# Patient Record
Sex: Male | Born: 1990 | Race: White | Hispanic: No | Marital: Single | Smoking: Current every day smoker
Health system: Southern US, Community
[De-identification: ages and names within clinical notes are randomized; demographics above are authoritative.]

## PROBLEM LIST (undated history)

## (undated) DIAGNOSIS — A630 Anogenital (venereal) warts: Secondary | ICD-10-CM

## (undated) DIAGNOSIS — F1910 Other psychoactive substance abuse, uncomplicated: Secondary | ICD-10-CM

## (undated) DIAGNOSIS — F419 Anxiety disorder, unspecified: Secondary | ICD-10-CM

## (undated) DIAGNOSIS — N200 Calculus of kidney: Secondary | ICD-10-CM

## (undated) HISTORY — DX: Anxiety disorder, unspecified: F41.9

## (undated) HISTORY — PX: WISDOM TOOTH EXTRACTION: SHX21

## (undated) HISTORY — DX: Other psychoactive substance abuse, uncomplicated: F19.10

---

## 2013-07-13 ENCOUNTER — Encounter (HOSPITAL_COMMUNITY): Payer: Self-pay | Admitting: Emergency Medicine

## 2013-07-13 ENCOUNTER — Emergency Department (HOSPITAL_COMMUNITY)
Admission: EM | Admit: 2013-07-13 | Discharge: 2013-07-13 | Disposition: A | Discharge status: Home / Self Care | Payer: PRIVATE HEALTH INSURANCE | Attending: Emergency Medicine | Admitting: Emergency Medicine

## 2013-07-13 DIAGNOSIS — S61209A Unspecified open wound of unspecified finger without damage to nail, initial encounter: Secondary | ICD-10-CM | POA: Insufficient documentation

## 2013-07-13 DIAGNOSIS — F17200 Nicotine dependence, unspecified, uncomplicated: Secondary | ICD-10-CM | POA: Insufficient documentation

## 2013-07-13 DIAGNOSIS — W260XXA Contact with knife, initial encounter: Secondary | ICD-10-CM | POA: Insufficient documentation

## 2013-07-13 DIAGNOSIS — Y9389 Activity, other specified: Secondary | ICD-10-CM | POA: Insufficient documentation

## 2013-07-13 DIAGNOSIS — Y929 Unspecified place or not applicable: Secondary | ICD-10-CM | POA: Insufficient documentation

## 2013-07-13 DIAGNOSIS — S61219A Laceration without foreign body of unspecified finger without damage to nail, initial encounter: Secondary | ICD-10-CM

## 2013-07-13 NOTE — ED Provider Notes (Signed)
CSN: [id number]     Arrival date & time 07/13/13  0134 History   First MD Initiated Contact with Patient 07/13/13 0154     Chief Complaint  Patient presents with  . Extremity Laceration   (Consider location/radiation/quality/duration/timing/severity/associated sxs/prior Treatment) HPI Patient presents emergency department with a laceration to his left index finger.  Patient, states this occurred just prior to arrival while stripping, electrical wire.  Patient, states, that the area bled a fair amount, but did apply pressure, and bleeding has stopped.  Patient denies numbness, or weakness in the finger History reviewed. No pertinent past medical history. History reviewed. No pertinent past surgical history. History reviewed. No pertinent family history. History  Substance Use Topics  . Smoking status: Current Every Day Smoker  . Smokeless tobacco: Not on file  . Alcohol Use: Yes    Review of Systems All other systems negative except as documented in the HPI. All pertinent positives and negatives as reviewed in the HPI. Allergies  Review of patient's allergies indicates not on file.  Home Medications  No current outpatient prescriptions on file. BP 142/72  Pulse 96  Temp(Src) 98.3 F (36.8 C) (Oral)  Resp 20  SpO2 100% Physical Exam  Nursing note and vitals reviewed. Constitutional: He is oriented to person, place, and time. He appears well-developed and well-nourished. No distress.  HENT:  Head: Normocephalic and atraumatic.  Mouth/Throat: Oropharynx is clear and moist.  Eyes: Pupils are equal, round, and reactive to light.  Neck: Normal range of motion. Neck supple.  Musculoskeletal:       Left hand: He exhibits normal range of motion, no tenderness and no deformity. Normal sensation noted. Normal strength noted. He exhibits no finger abduction and no thumb/finger opposition.       Hands: Neurological: He is alert and oriented to person, place, and time.    ED Course    Procedures (including critical care time) LACERATION REPAIR Performed by: Carlyle Dolly Authorized by: Carlyle Dolly Consent: Verbal consent obtained. Risks and benefits: risks, benefits and alternatives were discussed Consent given by: patient Patient identity confirmed: provided demographic data Prepped and Draped in normal sterile fashion Wound explored  Laceration Location: Palmer aspect of the left index finger  Laceration Length: 2.1cm  No Foreign Bodies seen or palpated  Anesthesia: local infiltration  Local anesthetic: None   Anesthetic total: N/A   Irrigation method: syringe Amount of cleaning: standard  Skin closure: Dermabond   Number of sutures: N/A   Technique: Dermabond   Patient tolerance: Patient tolerated the procedure well with no immediate complications. Patient is advised that his wound is fairly superficial except for the deeper puncture.  Area.  Patient's full range of motion of his finger and good strength, and sensation.  Patient has normal movement and range of motion of the DIP MDM      Carlyle Dolly, PA-C 07/13/13 0236

## 2013-07-13 NOTE — ED Notes (Signed)
Pt arrived to Ed with a finger laceration.  Pt cut his left index finger while trying to strip electrical wire with a knife.  Laceration is approximately 2 cm long located on the distal portion of the left index finger.  Bleeding has stopped but pt states he bleed "a lot" prior to arrival.

## 2013-07-13 NOTE — ED Notes (Signed)
2 cm laceration noted to left index finger. No bleeding noted.

## 2013-07-13 NOTE — ED Provider Notes (Signed)
Medical screening examination/treatment/procedure(s) were performed by non-physician practitioner and as supervising physician I was immediately available for consultation/collaboration.  Sunnie Nielsen, MD 07/13/13 [phone]

## 2013-11-19 ENCOUNTER — Ambulatory Visit: Payer: Self-pay | Admitting: Family

## 2013-11-20 ENCOUNTER — Encounter: Payer: Self-pay | Admitting: Family

## 2013-11-20 ENCOUNTER — Ambulatory Visit (INDEPENDENT_AMBULATORY_CARE_PROVIDER_SITE_OTHER): Payer: PRIVATE HEALTH INSURANCE | Admitting: Family

## 2013-11-20 VITALS — BP 118/60 | HR 77 | Ht 73.0 in | Wt 164.0 lb

## 2013-11-20 DIAGNOSIS — A630 Anogenital (venereal) warts: Secondary | ICD-10-CM

## 2013-11-20 DIAGNOSIS — M545 Low back pain, unspecified: Secondary | ICD-10-CM

## 2013-11-20 DIAGNOSIS — G8929 Other chronic pain: Secondary | ICD-10-CM

## 2013-11-20 MED ORDER — PODOFILOX 0.5 % EX GEL: Freq: Two times a day (BID) | CUTANEOUS | Status: DC

## 2013-11-20 NOTE — Patient Instructions (Addendum)
Genital Warts Genital warts are a sexually transmitted infection. They may appear as small bumps on the tissues of the genital area. CAUSES  Genital warts are caused by a virus called human papillomavirus (HPV). HPV is the most common sexually transmitted disease (STD) and infection of the sex organs. This infection is spread by having unprotected sex with an infected person. It can be spread by vaginal, anal, and oral sex. Many people do not know they are infected. They may be infected for years without problems. However, even if they do not have problems, they can unknowingly pass the infection to their sexual partners. SYMPTOMS   Itching and irritation in the genital area.  Warts that bleed.  Painful sexual intercourse. DIAGNOSIS  Warts are usually recognized with the naked eye on the vagina, vulva, perineum, anus, and rectum. Certain tests can also diagnose genital warts, such as:  A Pap test.  A tissue sample (biopsy) exam.  Colposcopy. A magnifying tool is used to examine the vagina and cervix. The HPV cells will change color when certain solutions are used. TREATMENT  Warts can be removed by:  Applying certain chemicals, such as cantharidin or podophyllin.  Liquid nitrogen freezing (cryotherapy).  Immunotherapy with candida or trichophyton injections.  Laser treatment.  Burning with an electrified probe (electrocautery).  Interferon injections.  Surgery. PREVENTION  HPV vaccination can help prevent HPV infections that cause genital warts and that cause cancer of the cervix. It is recommended that the vaccination be given to people between the ages 53 to 50 years old. The vaccine might not work as well or might not work at all if you already have HPV. It should not be given to pregnant women. HOME CARE INSTRUCTIONS   It is important to follow your caregiver's instructions. The warts will not go away without treatment. Repeat treatments are often needed to get rid of warts.  Even after it appears that the warts are gone, the normal tissue underneath often remains infected.  Do not try to treat genital warts with medicine used to treat hand warts. This type of medicine is strong and can burn the skin in the genital area, causing more damage.  Tell your past and current sexual partner(s) that you have genital warts. They may be infected also and need treatment.  Avoid sexual contact while being treated.  Do not touch or scratch the warts. The infection may spread to other parts of your body.  Women with genital warts should have a cervical cancer check (Pap test) at least once a year. This type of cancer is slow-growing and can be cured if found early. Chances of developing cervical cancer are increased with HPV.  Inform your obstetrician about your warts in the event of pregnancy. This virus can be passed to the baby's respiratory tract. Discuss this with your caregiver.  Use a condom during sexual intercourse. Following treatment, the use of condoms will help prevent reinfection.  Ask your caregiver about using over-the-counter anti-itch creams. SEEK MEDICAL CARE IF:   Your treated skin becomes red, swollen, or painful.  You have a fever.  You feel generally ill.  You feel little lumps in and around your genital area.  You are bleeding or have painful sexual intercourse. MAKE SURE YOU:   Understand these instructions.  Will watch your condition.  Will get help right away if you are not doing well or get worse. Document Released: 09/23/2000 Document Revised: 12/19/2011 Document Reviewed: 04/04/2011 [facility] Patient Information 2014 [place], [place]. Safe  Sex Safe sex is about reducing the risk of giving or getting a sexually transmitted disease (STD). STDs are spread through sexual contact involving the genitals, mouth, or rectum. Some STDS can be cured and others cannot. Safe sex can also prevent unintended pregnancies.  SAFE SEX PRACTICES  Limit  your sexual activity to only one partner who is only having sex with you.  Talk to your partner about their past partners, past STDs, and drug use.  Use a condom every time you have sexual intercourse. This includes vaginal, oral, and anal sexual activity. Both females and males should wear condoms during oral sex. Only use latex or polyurethane condoms and water-based lubricants. Petroleum-based lubricants or oils used to lubricate a condom will weaken the condom and increase the chance that it will break. The condom should be in place from the beginning to the end of sexual activity. Wearing a condom reduces, but does not completely eliminate, your risk of getting or giving a STD. STDs can be spread by contact with skin of surrounding areas.  Get vaccinated for hepatitis B and HPV.  Avoid alcohol and recreational drugs which can affect your judgement. You may forget to use a condom or participate in high-risk sex.  For females, avoid douching after sexual intercourse. Douching can spread an infection farther into the reproductive tract.  Check your body for signs of sores, blisters, rashes, or unusual discharge. See your caregiver if you notice any of these signs.  Avoid sexual contact if you have symptoms of an infection or are being treated for an STD. If you or your partner has herpes, avoid sexual contact when blisters are present. Use condoms at all other times.  See your caregiver for regular screenings, examinations, and tests for STDs. Before having sex with a new partner, each of you should be screened for STDs and talk about the results with your partner. BENEFITS OF SAFE SEX   There is less of a chance of getting or giving an STD.  You can prevent unwanted or unintended pregnancies.  By discussing safer sex concerns with your partner, you may increase feelings of intimacy, comfort, trust, and honesty between the both of you. Document Released: 11/03/2004 Document Revised:  06/20/2012 Document Reviewed: 03/19/2012 [employer] Patient Information 2014 [place]

## 2013-11-20 NOTE — Progress Notes (Signed)
Pre visit review using our clinic review tool, if applicable. No additional management support is needed unless otherwise documented below in the visit note. 
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
## 2013-11-21 ENCOUNTER — Telehealth: Payer: Self-pay | Admitting: Family

## 2013-11-21 ENCOUNTER — Emergency Department (HOSPITAL_COMMUNITY): Payer: PRIVATE HEALTH INSURANCE

## 2013-11-21 ENCOUNTER — Emergency Department (HOSPITAL_COMMUNITY)
Admission: EM | Admit: 2013-11-21 | Discharge: 2013-11-21 | Disposition: A | Discharge status: Home / Self Care | Payer: PRIVATE HEALTH INSURANCE | Attending: Emergency Medicine | Admitting: Emergency Medicine

## 2013-11-21 ENCOUNTER — Encounter: Payer: Self-pay | Admitting: Family

## 2013-11-21 ENCOUNTER — Encounter (HOSPITAL_COMMUNITY): Payer: Self-pay | Admitting: Emergency Medicine

## 2013-11-21 DIAGNOSIS — F17200 Nicotine dependence, unspecified, uncomplicated: Secondary | ICD-10-CM | POA: Insufficient documentation

## 2013-11-21 DIAGNOSIS — Z79899 Other long term (current) drug therapy: Secondary | ICD-10-CM | POA: Insufficient documentation

## 2013-11-21 DIAGNOSIS — M549 Dorsalgia, unspecified: Secondary | ICD-10-CM

## 2013-11-21 DIAGNOSIS — F411 Generalized anxiety disorder: Secondary | ICD-10-CM | POA: Insufficient documentation

## 2013-11-21 DIAGNOSIS — Z87828 Personal history of other (healed) physical injury and trauma: Secondary | ICD-10-CM | POA: Insufficient documentation

## 2013-11-21 MED ORDER — DIAZEPAM 5 MG PO TABS
10.0000 mg | ORAL_TABLET | Freq: Once | ORAL | Status: AC
  Administered 2013-11-21: 10 mg via ORAL
  Filled 2013-11-21: qty 2

## 2013-11-21 MED ORDER — DIAZEPAM 5 MG PO TABS
5.0000 mg | ORAL_TABLET | Freq: Two times a day (BID) | ORAL | Status: DC
Start: 2013-11-21 — End: 2013-12-04
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
## 2013-11-21 NOTE — Discharge Instructions (Signed)
Back Pain, Adult Low back pain is very common. About 1 in 5 people have back pain.The cause of low back pain is rarely dangerous. The pain often gets better over time.About half of people with a sudden onset of back pain feel better in just 2 weeks. About 8 in 10 people feel better by 6 weeks.  CAUSES Some common causes of back pain include:  Strain of the muscles or ligaments supporting the spine.  Wear and tear (degeneration) of the spinal discs.  Arthritis.  Direct injury to the back. DIAGNOSIS Most of the time, the direct cause of low back pain is not known.However, back pain can be treated effectively even when the exact cause of the pain is unknown.Answering your caregiver's questions about your overall health and symptoms is one of the most accurate ways to make sure the cause of your pain is not dangerous. If your caregiver needs more information, he or she may order lab work or imaging tests (X-rays or MRIs).However, even if imaging tests show changes in your back, this usually does not require surgery. HOME CARE INSTRUCTIONS For many people, back pain returns.Since low back pain is rarely dangerous, it is often a condition that people can learn to manageon their own.   Remain active. It is stressful on the back to sit or stand in one place. Do not sit, drive, or stand in one place for more than 30 minutes at a time. Take short walks on level surfaces as soon as pain allows.Try to increase the length of time you walk each day.  Do not stay in bed.Resting more than 1 or 2 days can delay your recovery.  Do not avoid exercise or work.Your body is made to move.It is not dangerous to be active, even though your back may hurt.Your back will likely heal faster if you return to being active before your pain is gone.  Pay attention to your body when you bend and lift. Many people have less discomfortwhen lifting if they bend their knees, keep the load close to their bodies,and  avoid twisting. Often, the most comfortable positions are those that put less stress on your recovering back.  Find a comfortable position to sleep. Use a firm mattress and lie on your side with your knees slightly bent. If you lie on your back, put a pillow under your knees.  Only take over-the-counter or prescription medicines as directed by your caregiver. Over-the-counter medicines to reduce pain and inflammation are often the most helpful.Your caregiver may prescribe muscle relaxant drugs.These medicines help dull your pain so you can more quickly return to your normal activities and healthy exercise.  Put ice on the injured area.  Put ice in a plastic bag.  Place a towel between your skin and the bag.  Leave the ice on for 15-20 minutes, 03-04 times a day for the first 2 to 3 days. After that, ice and heat may be alternated to reduce pain and spasms.  Ask your caregiver about trying back exercises and gentle massage. This may be of some benefit.  Avoid feeling anxious or stressed.Stress increases muscle tension and can worsen back pain.It is important to recognize when you are anxious or stressed and learn ways to manage it.Exercise is a great option. SEEK MEDICAL CARE IF:  You have pain that is not relieved with rest or medicine.  You have pain that does not improve in 1 week.  You have new symptoms.  You are generally not feeling well. SEEK   IMMEDIATE MEDICAL CARE IF:   You have pain that radiates from your back into your legs.  You develop new bowel or bladder control problems.  You have unusual weakness or numbness in your arms or legs.  You develop nausea or vomiting.  You develop abdominal pain.  You feel faint. Document Released: 09/26/2005 Document Revised: 03/27/2012 Document Reviewed: 02/14/2011 [employer] Patient Information 2014 [employer].  
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
## 2013-11-21 NOTE — ED Provider Notes (Signed)
Medical screening examination/treatment/procedure(s) were performed by non-physician practitioner and as supervising physician I was immediately available for consultation/collaboration.  EKG Interpretation   None         Audree CamelScott T Albaro Deviney, MD 11/21/13 2338

## 2013-11-21 NOTE — Telephone Encounter (Signed)
Relevant patient education mailed to patient.  

## 2013-11-23 ENCOUNTER — Emergency Department (HOSPITAL_COMMUNITY): Admit: 2013-11-23 | Discharge: 2013-11-23 | Discharge status: Against Medical Advice | Payer: PRIVATE HEALTH INSURANCE

## 2013-11-25 ENCOUNTER — Other Ambulatory Visit: Payer: PRIVATE HEALTH INSURANCE

## 2013-11-27 ENCOUNTER — Other Ambulatory Visit: Payer: PRIVATE HEALTH INSURANCE

## 2013-12-02 ENCOUNTER — Other Ambulatory Visit: Payer: PRIVATE HEALTH INSURANCE

## 2013-12-04 ENCOUNTER — Ambulatory Visit (INDEPENDENT_AMBULATORY_CARE_PROVIDER_SITE_OTHER): Payer: PRIVATE HEALTH INSURANCE | Admitting: Family

## 2013-12-04 ENCOUNTER — Encounter: Payer: PRIVATE HEALTH INSURANCE | Admitting: Family

## 2013-12-04 ENCOUNTER — Encounter: Payer: Self-pay | Admitting: Family

## 2013-12-04 VITALS — BP 120/80 | HR 110 | Ht 73.0 in | Wt 172.0 lb

## 2013-12-04 DIAGNOSIS — Z0000 Encounter for general adult medical examination without abnormal findings: Secondary | ICD-10-CM

## 2013-12-04 DIAGNOSIS — M545 Low back pain, unspecified: Secondary | ICD-10-CM | POA: Insufficient documentation

## 2013-12-04 DIAGNOSIS — A630 Anogenital (venereal) warts: Secondary | ICD-10-CM | POA: Insufficient documentation

## 2013-12-04 DIAGNOSIS — G8929 Other chronic pain: Secondary | ICD-10-CM | POA: Insufficient documentation

## 2013-12-04 NOTE — Progress Notes (Signed)
Subjective:    Patient ID: William Mejia, male    DOB: 1990-10-11, 23 y.o.   MRN: [id number]  HPI 23 year old white male, smoker, is in today for complete physical exam. Has a history of chronic low back pain and is requesting to be referred to orthopedics. He's been seen in the emergency department for low back pain and was given Valium.  Report Xanax helps his pain better.    Review of Systems  Constitutional: Negative.   HENT: Negative.   Eyes: Negative.   Respiratory: Negative.   Cardiovascular: Negative.   Gastrointestinal: Negative.   Endocrine: Negative.   Genitourinary: Negative.   Musculoskeletal: Positive for back pain. Negative for gait problem, joint swelling and myalgias.  Skin: Negative.   Allergic/Immunologic: Negative.   Neurological: Negative.   Hematological: Negative.   Psychiatric/Behavioral: Negative.    Past Medical History  Diagnosis Date  . Substance abuse     meth  . Anxiety     History   Social History  . Marital Status: Single    Spouse Name: N/A    Number of Children: N/A  . Years of Education: N/A   Occupational History  . Not on file.   Social History Main Topics  . Smoking status: Current Every Day Smoker  . Smokeless tobacco: Not on file  . Alcohol Use: No  . Drug Use: Yes    Special: Marijuana     Comment: hx of methamphetamine abuse  . Sexual Activity: Yes   Other Topics Concern  . Not on file   Social History Narrative  . No narrative on file    Past Surgical History  Procedure Laterality Date  . Wisdom tooth extraction      Family History  Problem Relation Age of Onset  . Diabetes Mother     No Known Allergies  Current Outpatient Prescriptions on File Prior to Visit  Medication Sig Dispense Refill  . podofilox (CONDYLOX) 0.5 % gel Apply topically 2 (two) times daily. Twice a week only.  3.5 g  0   No current facility-administered medications on file prior to visit.    BP 120/80  Pulse 110  Ht 6'  1" (1.854 m)  Wt 172 lb (78.019 kg)  BMI 22.70 kg/m2chart    Objective:   Physical Exam  Constitutional: He is oriented to person, place, and time. He appears well-developed and well-nourished.  HENT:  Head: Normocephalic.  Right Ear: External ear normal.  Left Ear: External ear normal.  Nose: Nose normal.  Mouth/Throat: Oropharynx is clear and moist.  Eyes: Conjunctivae are normal. Pupils are equal, round, and reactive to light.  Neck: Normal range of motion. Neck supple.  Cardiovascular: Normal rate, regular rhythm and normal heart sounds.   Pulmonary/Chest: Effort normal and breath sounds normal.  Abdominal: Soft. Bowel sounds are normal.  Genitourinary: Penis normal.  Musculoskeletal: Normal range of motion.  Neurological: He is alert and oriented to person, place, and time. He has normal reflexes. No cranial nerve deficit.  Skin: Skin is warm and dry.  Psychiatric: He has a normal mood and affect.          Assessment & Plan:  William Mejia was seen today for annual exam.  Diagnoses and associated orders for this visit:  Preventative health care - CBC with Differential - TSH - POC Urinalysis Dipstick - Lipid Panel - CMP  Chronic low back pain    I have advised patient that I do not prescribe Xanax. And Xanax is  not indicated for chronic low back pain. Advised anti-inflammatory medications and followup with orthopedics as scheduled. Encouraged safe sex practices.

## 2013-12-04 NOTE — Progress Notes (Signed)
Pre visit review using our clinic review tool, if applicable. No additional management support is needed unless otherwise documented below in the visit note. 

## 2013-12-04 NOTE — Patient Instructions (Signed)
Chronic Back Pain   When back pain lasts longer than 3 months, it is called chronic back pain.People with chronic back pain often go through certain periods that are more intense (flare-ups).   CAUSES  Chronic back pain can be caused by wear and tear (degeneration) on different structures in your back. These structures include:   The bones of your spine (vertebrae) and the joints surrounding your spinal cord and nerve roots (facets).   The strong, fibrous tissues that connect your vertebrae (ligaments).  Degeneration of these structures may result in pressure on your nerves. This can lead to constant pain.  HOME CARE INSTRUCTIONS   Avoid bending, heavy lifting, prolonged sitting, and activities which make the problem worse.   Take brief periods of rest throughout the day to reduce your pain. Lying down or standing usually is better than sitting while you are resting.   Take over-the-counter or prescription medicines only as directed by your caregiver.  SEEK IMMEDIATE MEDICAL CARE IF:    You have weakness or numbness in one of your legs or feet.   You have trouble controlling your bladder or bowels.   You have nausea, vomiting, abdominal pain, shortness of breath, or fainting.  Document Released: 11/03/2004 Document Revised: 12/19/2011 Document Reviewed: 09/10/2011  Exit[employer] Patient Information 2014 [employer].

## 2013-12-06 ENCOUNTER — Telehealth: Payer: Self-pay | Admitting: Family

## 2013-12-06 NOTE — Telephone Encounter (Signed)
Relevant patient education assigned to patient using Emmi. ° °

## 2014-01-16 ENCOUNTER — Telehealth: Payer: Self-pay | Admitting: Family

## 2014-01-16 MED ORDER — EPINEPHRINE 0.3 MG/0.3ML IJ SOAJ: 0.3000 mg | Freq: Once | INTRAMUSCULAR | Status: DC

## 2014-01-16 NOTE — Telephone Encounter (Signed)
Pt would like a rx for epi-pen. Pt works outside, is allergic to bees and pine needles, poison ivy. PharmDorothy Puffer: [facility]/ [address]/  [phone]  ([facility])

## 2014-01-16 NOTE — Telephone Encounter (Signed)
Rx sent 

## 2016-01-21 ENCOUNTER — Emergency Department (HOSPITAL_COMMUNITY)
Admission: EM | Admit: 2016-01-21 | Discharge: 2016-01-21 | Disposition: A | Discharge status: Home / Self Care | Payer: PRIVATE HEALTH INSURANCE | Attending: Emergency Medicine | Admitting: Emergency Medicine

## 2016-01-21 ENCOUNTER — Encounter (HOSPITAL_COMMUNITY): Payer: Self-pay | Admitting: *Deleted

## 2016-01-21 DIAGNOSIS — S29002A Unspecified injury of muscle and tendon of back wall of thorax, initial encounter: Secondary | ICD-10-CM | POA: Diagnosis present

## 2016-01-21 DIAGNOSIS — Y998 Other external cause status: Secondary | ICD-10-CM | POA: Diagnosis not present

## 2016-01-21 DIAGNOSIS — M549 Dorsalgia, unspecified: Secondary | ICD-10-CM

## 2016-01-21 DIAGNOSIS — S199XXA Unspecified injury of neck, initial encounter: Secondary | ICD-10-CM | POA: Insufficient documentation

## 2016-01-21 DIAGNOSIS — Z79899 Other long term (current) drug therapy: Secondary | ICD-10-CM | POA: Diagnosis not present

## 2016-01-21 DIAGNOSIS — Y92410 Unspecified street and highway as the place of occurrence of the external cause: Secondary | ICD-10-CM | POA: Insufficient documentation

## 2016-01-21 DIAGNOSIS — S0990XA Unspecified injury of head, initial encounter: Secondary | ICD-10-CM | POA: Diagnosis not present

## 2016-01-21 DIAGNOSIS — F17200 Nicotine dependence, unspecified, uncomplicated: Secondary | ICD-10-CM | POA: Insufficient documentation

## 2016-01-21 DIAGNOSIS — Y9389 Activity, other specified: Secondary | ICD-10-CM | POA: Diagnosis not present

## 2016-01-21 DIAGNOSIS — Z8659 Personal history of other mental and behavioral disorders: Secondary | ICD-10-CM | POA: Diagnosis not present

## 2016-01-21 MED ORDER — IBUPROFEN 800 MG PO TABS: 800.0000 mg | ORAL_TABLET | Freq: Three times a day (TID) | ORAL | Status: DC

## 2016-01-21 MED ORDER — METHOCARBAMOL 500 MG PO TABS: 500.0000 mg | ORAL_TABLET | Freq: Two times a day (BID) | ORAL | Status: DC
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
## 2016-05-16 ENCOUNTER — Encounter (HOSPITAL_COMMUNITY): Payer: Self-pay

## 2016-05-16 ENCOUNTER — Emergency Department (HOSPITAL_COMMUNITY)
Admission: EM | Admit: 2016-05-16 | Discharge: 2016-05-16 | Disposition: A | Discharge status: Home / Self Care | Payer: PRIVATE HEALTH INSURANCE | Attending: Emergency Medicine | Admitting: Emergency Medicine

## 2016-05-16 DIAGNOSIS — F17200 Nicotine dependence, unspecified, uncomplicated: Secondary | ICD-10-CM | POA: Insufficient documentation

## 2016-05-16 DIAGNOSIS — B9789 Other viral agents as the cause of diseases classified elsewhere: Secondary | ICD-10-CM

## 2016-05-16 DIAGNOSIS — J069 Acute upper respiratory infection, unspecified: Secondary | ICD-10-CM | POA: Insufficient documentation

## 2016-05-16 DIAGNOSIS — K0889 Other specified disorders of teeth and supporting structures: Secondary | ICD-10-CM | POA: Insufficient documentation

## 2016-05-16 LAB — RAPID STREP SCREEN (MED CTR MEBANE ONLY): Streptococcus, Group A Screen (Direct): NEGATIVE

## 2016-05-16 MED ORDER — KETOROLAC TROMETHAMINE 60 MG/2ML IM SOLN
30.0000 mg | Freq: Once | INTRAMUSCULAR | Status: AC
  Administered 2016-05-16: 30 mg via INTRAMUSCULAR
  Filled 2016-05-16: qty 2

## 2016-05-16 MED ORDER — PENICILLIN V POTASSIUM 500 MG PO TABS: 500.0000 mg | ORAL_TABLET | Freq: Three times a day (TID) | ORAL | 0 refills | Status: DC

## 2016-05-16 MED ORDER — NAPROXEN 500 MG PO TABS: 500.0000 mg | ORAL_TABLET | Freq: Two times a day (BID) | ORAL | 0 refills | Status: DC

## 2016-05-16 MED ORDER — PROMETHAZINE-PHENYLEPH-CODEINE 6.25-5-10 MG/5ML PO SYRP: 5.0000 mL | ORAL_SOLUTION | Freq: Four times a day (QID) | ORAL | 0 refills | Status: DC | PRN

## 2016-05-16 NOTE — ED Notes (Signed)
Patient states his tooth broke off during eating. He states he feels like that is what is wrong with him.

## 2016-05-16 NOTE — ED Triage Notes (Signed)
Pt states recently had URI. Pt complaining of sinus congestion and pain. Pt states some productive cough.

## 2016-05-16 NOTE — ED Provider Notes (Signed)
[facility]-EMERGENCY DEPT Provider Note   CSN: [id number] Arrival date & time: 05/16/16  2043  First Provider Contact:  None       History   Chief Complaint Chief Complaint  Patient presents with  . Cough  . Nasal Congestion    HPI William Mejia is a 25 y.o. male.  Patient presents with several day history of nasal congestion, rhinorrhea, cough, sore throat.  Broken lower molar on right yesterday with onset of facial swelling today.  No shortness of breath, difficulty swallowing.      Past Medical History:  Diagnosis Date  . Anxiety   . Substance abuse    meth    Patient Active Problem List   Diagnosis Date Noted  . Chronic low back pain 12/04/2013  . 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 WISDOM TOOTH EXTRACTION         Home Medications    Prior to Admission medications   Medication Sig Start Date End Date Taking? Authorizing Provider  EPINEPHrine (EPI-PEN) 0.3 mg/0.3 mL SOAJ injection Inject 0.3 mLs (0.3 mg total) into the muscle once. 01/16/14   Eulis FosterPadonda B Webb, FNP  ibuprofen (ADVIL,MOTRIN) 800 MG tablet Take 1 tablet (800 mg total) by mouth 3 (three) times daily. 01/21/16   Mady GemmaElizabeth C Westfall, PA-C  methocarbamol (ROBAXIN) 500 MG tablet Take 1 tablet (500 mg total) by mouth 2 (two) times daily. 01/21/16   Mady GemmaElizabeth C Westfall, PA-C  podofilox (CONDYLOX) 0.5 % gel Apply topically 2 (two) times daily. Twice a week only. 11/20/13   Eulis FosterPadonda B Webb, FNP    Family History Family History  Problem Relation Age of Onset  . Diabetes Mother     Social History Social History  Substance Use Topics  . Smoking status: Current Every Day Smoker  . Smokeless tobacco: Never Used  . Alcohol use No     Allergies   Review of patient's allergies indicates no known allergies.   Review of Systems Review of Systems  HENT: Positive for congestion, dental problem and sore throat.   Respiratory: Positive for cough.     All other systems reviewed and are negative.    Physical Exam Updated Vital Signs BP 142/68   Pulse 100   Temp 98.3 F (36.8 C) (Oral)   Resp 16   Ht 6\' 1"  (1.854 m)   Wt 84.8 kg   SpO2 99%   BMI 24.67 kg/m   Physical Exam  Constitutional: He is oriented to person, place, and time. He appears well-developed and well-nourished.  HENT:  Head: Normocephalic and atraumatic.  Mouth/Throat: Abnormal dentition. Posterior oropharyngeal erythema present. No oropharyngeal exudate.    Eyes: Conjunctivae are normal.  Neck: Neck supple.  Cardiovascular: Normal rate and regular rhythm.   Pulmonary/Chest: Effort normal and breath sounds normal.  Abdominal: Soft. Bowel sounds are normal.  Musculoskeletal: Normal range of motion.  Lymphadenopathy:    He has no cervical adenopathy.  Neurological: He is alert and oriented to person, place, and time.  Skin: Skin is warm and dry. No rash noted.  Nursing note and vitals reviewed.    ED Treatments / Results  Labs (all labs ordered are listed, but only abnormal results are displayed) Labs Reviewed  RAPID STREP SCREEN (NOT [facility])  CULTURE, GROUP A STREP [facility])    EKG  EKG Interpretation None       Radiology No results found.  Procedures Procedures (including critical care time)  Medications Ordered in ED Medications - No data to display   Initial Impression / Assessment and Plan / ED Course  I have reviewed the triage vital signs and the nursing notes.  Pertinent labs & imaging results that were available during my care of the patient were reviewed by me and considered in my medical decision making (see chart for details).  Clinical Course  Patient with dentalgia.  No abscess requiring immediate incision and drainage.  Exam not concerning for Ludwig's angina or pharyngeal abscess.  Will treat with penicillin and naprosyn. Pt instructed to follow-up with dentist.  Discussed return precautions. Pt safe for discharge. Pt  symptoms consistent with URI. Pt will be discharged with symptomatic treatment.  Discussed return precautions.  Pt is hemodynamically stable & in NAD prior to discharge.   Final Clinical Impressions(s) / ED Diagnoses   Final diagnoses:  None  Viral URI with cough. Dentalgia.  New Prescriptions New Prescriptions   No medications on file     Felicie Morn, NP 05/17/16 0865    Pricilla Loveless, MD 05/18/16 [phone]

## 2016-05-19 LAB — CULTURE, GROUP A STREP (THRC)

## 2016-11-23 ENCOUNTER — Emergency Department (HOSPITAL_COMMUNITY)
Admission: EM | Admit: 2016-11-23 | Discharge: 2016-11-24 | Disposition: A | Discharge status: Home / Self Care | Payer: PRIVATE HEALTH INSURANCE | Attending: Emergency Medicine | Admitting: Emergency Medicine

## 2016-11-23 ENCOUNTER — Encounter (HOSPITAL_COMMUNITY): Payer: Self-pay | Admitting: Family Medicine

## 2016-11-23 DIAGNOSIS — F17200 Nicotine dependence, unspecified, uncomplicated: Secondary | ICD-10-CM | POA: Insufficient documentation

## 2016-11-23 DIAGNOSIS — K0889 Other specified disorders of teeth and supporting structures: Secondary | ICD-10-CM | POA: Diagnosis not present

## 2016-11-23 MED ORDER — ACETAMINOPHEN 325 MG PO TABS
650.0000 mg | ORAL_TABLET | Freq: Once | ORAL | Status: AC
  Administered 2016-11-24: 650 mg via ORAL
  Filled 2016-11-23: qty 2

## 2016-11-23 MED ORDER — IBUPROFEN 800 MG PO TABS
800.0000 mg | ORAL_TABLET | Freq: Once | ORAL | Status: AC
  Administered 2016-11-24: 800 mg via ORAL
  Filled 2016-11-23: qty 1

## 2016-11-23 MED ORDER — PENICILLIN V POTASSIUM 500 MG PO TABS: 500.0000 mg | ORAL_TABLET | Freq: Two times a day (BID) | ORAL | 0 refills | Status: AC

## 2016-11-23 NOTE — ED Triage Notes (Signed)
Patient is complaining of right lower toothache that started a week ago. Patient reports part of his molar has broke away and had complications before with this tooth.
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
## 2016-11-23 NOTE — ED Provider Notes (Signed)
[facility]-EMERGENCY DEPT Provider Note   CSN: [id number] Arrival date & time: 11/23/16  2232     History   Chief Complaint Chief Complaint  Patient presents with  . Dental Pain    HPI William Mejia is a 26 y.o. male.  HPI  26 y.o. male with a hx of Substance Abuse, presents to the Emergency Department today complaining of right lower tooth pain x 1 week. States molar broke away. Notes pain constantly with pain 8/10. No swelling. Able to tolerate PO. No N/V. No fevers. Attempted Motrin with moderate relief. PT has dentist, but has not followed up due to work. No other symptoms noted.    Past Medical History:  Diagnosis Date  . Anxiety   . Substance abuse    meth    Patient Active Problem List   Diagnosis Date Noted  . Chronic low back pain 12/04/2013  . 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 WISDOM TOOTH EXTRACTION         Home Medications    Prior to Admission medications   Medication Sig Start Date End Date Taking? Authorizing Provider  EPINEPHrine (EPI-PEN) 0.3 mg/0.3 mL SOAJ injection Inject 0.3 mLs (0.3 mg total) into the muscle once. 01/16/14   Eulis FosterPadonda B Webb, FNP  ibuprofen (ADVIL,MOTRIN) 800 MG tablet Take 1 tablet (800 mg total) by mouth 3 (three) times daily. 01/21/16   Mady GemmaElizabeth C Westfall, PA-C  methocarbamol (ROBAXIN) 500 MG tablet Take 1 tablet (500 mg total) by mouth 2 (two) times daily. 01/21/16   Mady GemmaElizabeth C Westfall, PA-C  naproxen (NAPROSYN) 500 MG tablet Take 1 tablet (500 mg total) by mouth 2 (two) times daily. 05/16/16   Felicie Mornavid Smith, NP  penicillin v potassium (VEETID) 500 MG tablet Take 1 tablet (500 mg total) by mouth 3 (three) times daily. 05/16/16   Felicie Mornavid Smith, NP  podofilox (CONDYLOX) 0.5 % gel Apply topically 2 (two) times daily. Twice a week only. 11/20/13   Eulis FosterPadonda B Webb, FNP  Promethazine-Phenyleph-Codeine 6.25-5-10 MG/5ML SYRP Take 5 mLs by mouth every 6 (six) hours as needed. 05/16/16    Felicie Mornavid Smith, NP    Family History Family History  Problem Relation Age of Onset  . Diabetes Mother     Social History Social History  Substance Use Topics  . Smoking status: Current Every Day Smoker  . Smokeless tobacco: Never Used  . Alcohol use No     Allergies   Patient has no known allergies.   Review of Systems Review of Systems  Constitutional: Negative for fever.  HENT: Positive for dental problem. Negative for trouble swallowing.      Physical Exam Updated Vital Signs BP 135/70 (BP Location: Right Arm)   Pulse 71   Temp 98.3 F (36.8 C) (Oral)   Resp 18   Ht 6\' 1"  (1.854 m)   Wt 83.9 kg   SpO2 100%   BMI 24.41 kg/m   Physical Exam  Constitutional: He is oriented to person, place, and time. Vital signs are normal. He appears well-developed and well-nourished.  HENT:  Head: Normocephalic.  Right Ear: Hearing normal.  Left Ear: Hearing normal.  Left molar with poor dentition. No abscess. No trismus. No uvula deviation. NO signs of infection   Eyes: Conjunctivae and EOM are normal. Pupils are equal, round, and reactive to light.  Cardiovascular: Normal rate and regular rhythm.   Pulmonary/Chest: Effort normal.  Neurological: He is alert and oriented to person,  place, and time.  Skin: Skin is warm and dry.  Psychiatric: He has a normal mood and affect. His speech is normal and behavior is normal. Thought content normal.   ED Treatments / Results  Labs (all labs ordered are listed, but only abnormal results are displayed) Labs Reviewed - No data to display  EKG  EKG Interpretation None       Radiology No results found.  Procedures Procedures (including critical care time)  Medications Ordered in ED Medications - No data to display   Initial Impression / Assessment and Plan / ED Course  I have reviewed the triage vital signs and the nursing notes.  Pertinent labs & imaging results that were available during my care of the patient were  reviewed by me and considered in my medical decision making (see chart for details).  Final Clinical Impressions(s) / ED Diagnoses     {I have reviewed the relevant previous healthcare records.  {I obtained HPI from historian.   ED Course:  Assessment: Dental pain associated with dental cary but no signs or symptoms of dental abscess with patient afebrile, non toxic appearing and swallowing secretions well. Exam unconcerning for Ludwig's angina or other deep tissue infection in neck. As there is no facial swelling or gum findings. Will treat with pain medication.  I gave patient referral to dentist and stressed the importance of dental follow up for ultimate management of dental pain. Patient voices understanding and is agreeable to plan.  Disposition/Plan:  DC Home Additional Verbal discharge instructions given and discussed with patient.  Pt Instructed to f/u with Dentist in the next week for evaluation and treatment of symptoms. Return precautions given Pt acknowledges and agrees with plan  Supervising Physician Canary Brim Tegeler, MD  Final diagnoses:  Pain, dental    New Prescriptions New Prescriptions   No medications on file     Audry Pili, PA-C 11/24/16 0000    Canary Brim Tegeler, MD 11/24/16 [phone]

## 2016-11-24 MED ORDER — IBUPROFEN 800 MG PO TABS: 800.0000 mg | ORAL_TABLET | Freq: Four times a day (QID) | ORAL | 0 refills | Status: DC | PRN

## 2017-03-27 ENCOUNTER — Encounter (HOSPITAL_BASED_OUTPATIENT_CLINIC_OR_DEPARTMENT_OTHER): Payer: Self-pay | Admitting: *Deleted

## 2017-03-27 ENCOUNTER — Emergency Department (HOSPITAL_BASED_OUTPATIENT_CLINIC_OR_DEPARTMENT_OTHER)
Admission: EM | Admit: 2017-03-27 | Discharge: 2017-03-28 | Disposition: A | Discharge status: Home / Self Care | Payer: BLUE CROSS/BLUE SHIELD | Attending: Emergency Medicine | Admitting: Emergency Medicine

## 2017-03-27 ENCOUNTER — Emergency Department (HOSPITAL_BASED_OUTPATIENT_CLINIC_OR_DEPARTMENT_OTHER): Payer: BLUE CROSS/BLUE SHIELD

## 2017-03-27 DIAGNOSIS — R1032 Left lower quadrant pain: Secondary | ICD-10-CM | POA: Diagnosis present

## 2017-03-27 DIAGNOSIS — N50812 Left testicular pain: Secondary | ICD-10-CM | POA: Diagnosis not present

## 2017-03-27 DIAGNOSIS — N23 Unspecified renal colic: Secondary | ICD-10-CM

## 2017-03-27 DIAGNOSIS — F17200 Nicotine dependence, unspecified, uncomplicated: Secondary | ICD-10-CM | POA: Insufficient documentation

## 2017-03-27 DIAGNOSIS — N132 Hydronephrosis with renal and ureteral calculous obstruction: Secondary | ICD-10-CM | POA: Diagnosis not present

## 2017-03-27 DIAGNOSIS — N451 Epididymitis: Secondary | ICD-10-CM | POA: Diagnosis not present

## 2017-03-27 LAB — URINALYSIS, ROUTINE W REFLEX MICROSCOPIC
BILIRUBIN URINE: NEGATIVE
Glucose, UA: NEGATIVE mg/dL
Ketones, ur: NEGATIVE mg/dL
Nitrite: NEGATIVE
PH: 6.5 (ref 5.0–8.0)
Protein, ur: NEGATIVE mg/dL
SPECIFIC GRAVITY, URINE: 1.02 (ref 1.005–1.030)

## 2017-03-27 LAB — URINALYSIS, MICROSCOPIC (REFLEX)

## 2017-03-27 MED ORDER — HYDROMORPHONE HCL 1 MG/ML IJ SOLN
2.0000 mg | Freq: Once | INTRAMUSCULAR | Status: AC
Start: 2017-03-27 — End: 2017-03-27
  Administered 2017-03-27: 2 mg via INTRAMUSCULAR
  Filled 2017-03-27: qty 2

## 2017-03-27 MED ORDER — ONDANSETRON 8 MG PO TBDP
8.0000 mg | ORAL_TABLET | Freq: Once | ORAL | Status: AC
  Administered 2017-03-27: 8 mg via ORAL
  Filled 2017-03-27: qty 1

## 2017-03-27 NOTE — ED Triage Notes (Signed)
[facility] was seen at [facility] 2 hours ago for pain in his left testicle. 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

## 2017-03-27 NOTE — ED Provider Notes (Signed)
[facility] Provider Note: William DellJ. Lane [name], MD, FACEP  CSN: [id number] MRN: [id number] ARRIVAL: 03/27/17 at 2137 ROOM: MH11/[facility]11   CHIEF COMPLAINT  Testicle Pain   HISTORY OF PRESENT ILLNESS  William Mejia is a 26 y.o. male who developed the fairly sudden onset of left groin and testicular pain about 4 PM at work today. He changes tires for living but there was not a specific injury that he is aware of. He rates his pain as an 8 out of 10 and the pain is worse with palpation of the testicle or left lower quadrant. There was associated nausea and vomiting. He was seen in an [facility] and was told he had microscopic hematuria but no evidence of a urinary tract infection. He was sent here for further evaluation. He is able to urinate without difficulty or discomfort.   [place] with [place] [facility] [place] substances [place] reveals the patient has received one prescription for 10 hydrocodone tablets and one prescription for Phenergan with codeine syrup in the past year..   Past Medical History:  Diagnosis Date  . Anxiety   . Substance abuse    meth    Past Surgical History:  Procedure Laterality Date  . WISDOM TOOTH EXTRACTION      Family History  Problem Relation Age of Onset  . Diabetes Mother     Social History  Substance Use Topics  . Smoking status: Current Every Day Smoker  . Smokeless tobacco: Never Used  . Alcohol use No    Prior to Admission medications   Not on File    Allergies Patient has no known allergies.   REVIEW OF SYSTEMS  Negative except as noted here or in the History of Present Illness.   PHYSICAL EXAMINATION  Initial Vital Signs Blood pressure 138/85, pulse 90, temperature 98.3 F (36.8 C), temperature source Oral, resp. rate 18, height 6\' 1"  (1.854 m), weight 77.6 kg (171 lb), SpO2 100 %.  Examination General: Well-developed, well-nourished male in no acute distress; appearance consistent with  age of record HENT: normocephalic; atraumatic Eyes: pupils equal, round and reactive to light; extraocular muscles intact Neck: supple Heart: regular rate and rhythm Lungs: clear to auscultation bilaterally Abdomen: soft; nondistended; left lower quadrant tenderness; no masses or hepatosplenomegaly; bowel sounds present GU: Tanner 5 male, circumcised; testicles descended bilaterally with normal alignment; left testicular and inguinal tenderness without hernia or mass palpated Extremities: No deformity; full range of motion; pulses normal Neurologic: Awake, alert and oriented; motor function intact in all extremities and symmetric; no facial droop Skin: Warm and dry Psychiatric: Normal mood and affect   RESULTS  Summary of this visit's results, reviewed by myself:   EKG Interpretation  Date/Time:    Ventricular Rate:    PR Interval:    QRS Duration:   QT Interval:    QTC Calculation:   R Axis:     Text Interpretation:        Laboratory Studies: Results for orders placed or performed during the hospital encounter of 03/27/17 (from the past 24 hour(s))  Urinalysis, Routine w reflex microscopic     Status: Abnormal   Collection Time: 03/27/17 10:36 PM  Result Value Ref Range   Color, Urine YELLOW YELLOW   APPearance CLOUDY (A) CLEAR   Specific Gravity, Urine 1.020 1.005 - 1.030   pH 6.5 5.0 - 8.0   Glucose, UA NEGATIVE NEGATIVE mg/dL   Hgb urine dipstick LARGE (A) NEGATIVE   Bilirubin Urine NEGATIVE  NEGATIVE   Ketones, ur NEGATIVE NEGATIVE mg/dL   Protein, ur NEGATIVE NEGATIVE mg/dL   Nitrite NEGATIVE NEGATIVE   Leukocytes, UA TRACE (A) NEGATIVE  Urinalysis, Microscopic (reflex)     Status: Abnormal   Collection Time: 03/27/17 10:36 PM  Result Value Ref Range   RBC / HPF TOO NUMEROUS TO COUNT 0 - 5 RBC/hpf   WBC, UA 0-5 0 - 5 WBC/hpf   Bacteria, UA MANY (A) NONE SEEN   Squamous Epithelial / LPF 0-5 (A) NONE SEEN   Ca Oxalate Crys, UA PRESENT    Imaging Studies: Ct  Renal Stone Study  Result Date: 03/27/2017 CLINICAL DATA:  Left groin pain, onset today. EXAM: CT ABDOMEN AND PELVIS WITHOUT CONTRAST TECHNIQUE: Multidetector CT imaging of the abdomen and pelvis was performed following the standard protocol without IV contrast. COMPARISON:  None. FINDINGS: Lower chest: The lung bases are clear. No consolidation or pleural fluid. Hepatobiliary: The unenhanced liver is unremarkable. No evidence of focal lesion allowing for lack contrast. Gallbladder physiologically distended, no calcified stone. No biliary dilatation. Pancreas: No ductal dilatation or inflammation. Spleen: Normal in size without focal abnormality. Adrenals/Urinary Tract: Minimally obstructing 4 x 5 mm stone in the distal left ureter with minimal proximal left hydroureteronephrosis. No significant perinephric edema. No additional nonobstructing stone in either kidney. No right hydronephrosis. Right ureter is decompressed. Urinary bladder is minimally distended without bladder stone. The adrenal glands are normal. Stomach/Bowel: Stomach is within normal limits. Appendix appears normal. No evidence of bowel wall thickening, distention, or inflammatory changes. Vascular/Lymphatic: Abdominal aorta is normal in caliber. No abdominopelvic adenopathy. Reproductive: Prostate is unremarkable. Other: Bilateral pelvic phleboliths. No free air, free fluid, or intra-abdominal fluid collection. No abdominal wall hernia. Musculoskeletal: There are no acute or suspicious osseous abnormalities. Scattered Schmorl's nodes in the lower thoracic and upper lumbar spine. IMPRESSION: Obstructing 5 x 4 mm stone in the distal left ureter with mild left hydroureteronephrosis. Electronically Signed   By: Rubye Oaks M.D.   On: 03/27/2017 23:42    ED COURSE  Nursing notes and initial vitals signs, including pulse oximetry, reviewed.  Vitals:   03/27/17 2157 03/27/17 2159  BP:  138/85  Pulse:  90  Resp:  18  Temp:  98.3 F (36.8  C)  TempSrc:  Oral  SpO2:  100%  Weight: 77.6 kg (171 lb)   Height: 6\' 1"  (1.854 m)    12:03 AM Pain significantly improved after IM Dilaudid.  PROCEDURES    ED DIAGNOSES     ICD-10-CM   1. Ureteral colic N23        Alisah Grandberry, MD 03/28/17 0006

## 2017-03-27 NOTE — ED Notes (Signed)
Returned from xray

## 2017-03-28 MED ORDER — HYDROMORPHONE HCL 4 MG PO TABS: 2.0000 mg | ORAL_TABLET | ORAL | 0 refills | Status: DC | PRN

## 2017-03-28 MED ORDER — TAMSULOSIN HCL 0.4 MG PO CAPS: ORAL_CAPSULE | ORAL | 0 refills | Status: DC

## 2017-03-28 MED ORDER — ONDANSETRON 8 MG PO TBDP: 8.0000 mg | ORAL_TABLET | Freq: Three times a day (TID) | ORAL | 0 refills | Status: DC | PRN

## 2017-03-28 MED ORDER — TAMSULOSIN HCL 0.4 MG PO CAPS
0.4000 mg | ORAL_CAPSULE | Freq: Once | ORAL | Status: AC
  Administered 2017-03-28: 0.4 mg via ORAL
  Filled 2017-03-28: qty 1

## 2017-03-29 LAB — URINE CULTURE: CULTURE: NO GROWTH

## 2017-04-04 ENCOUNTER — Encounter (HOSPITAL_BASED_OUTPATIENT_CLINIC_OR_DEPARTMENT_OTHER): Payer: Self-pay

## 2017-04-04 ENCOUNTER — Emergency Department (HOSPITAL_BASED_OUTPATIENT_CLINIC_OR_DEPARTMENT_OTHER)
Admission: EM | Admit: 2017-04-04 | Discharge: 2017-04-04 | Disposition: A | Discharge status: Home / Self Care | Payer: BLUE CROSS/BLUE SHIELD | Attending: Emergency Medicine | Admitting: Emergency Medicine

## 2017-04-04 DIAGNOSIS — F17200 Nicotine dependence, unspecified, uncomplicated: Secondary | ICD-10-CM | POA: Diagnosis not present

## 2017-04-04 DIAGNOSIS — N200 Calculus of kidney: Secondary | ICD-10-CM | POA: Diagnosis not present

## 2017-04-04 DIAGNOSIS — R109 Unspecified abdominal pain: Secondary | ICD-10-CM | POA: Diagnosis not present

## 2017-04-04 DIAGNOSIS — R1032 Left lower quadrant pain: Secondary | ICD-10-CM | POA: Diagnosis present

## 2017-04-04 HISTORY — DX: Calculus of kidney: N20.0

## 2017-04-04 LAB — URINALYSIS, ROUTINE W REFLEX MICROSCOPIC
BILIRUBIN URINE: NEGATIVE
GLUCOSE, UA: NEGATIVE mg/dL
Hgb urine dipstick: NEGATIVE
Ketones, ur: NEGATIVE mg/dL
Leukocytes, UA: NEGATIVE
NITRITE: NEGATIVE
PH: 8 (ref 5.0–8.0)
Protein, ur: NEGATIVE mg/dL
SPECIFIC GRAVITY, URINE: 1.019 (ref 1.005–1.030)

## 2017-04-04 LAB — CBC
HEMATOCRIT: 44.5 % (ref 39.0–52.0)
HEMOGLOBIN: 15.2 g/dL (ref 13.0–17.0)
MCH: 31.3 pg (ref 26.0–34.0)
MCHC: 34.2 g/dL (ref 30.0–36.0)
MCV: 91.8 fL (ref 78.0–100.0)
Platelets: 338 10*3/uL (ref 150–400)
RBC: 4.85 MIL/uL (ref 4.22–5.81)
RDW: 13.5 % (ref 11.5–15.5)
WBC: 9.2 10*3/uL (ref 4.0–10.5)

## 2017-04-04 LAB — BASIC METABOLIC PANEL
Anion gap: 7 (ref 5–15)
BUN: 13 mg/dL (ref 6–20)
CALCIUM: 9.5 mg/dL (ref 8.9–10.3)
CO2: 27 mmol/L (ref 22–32)
Chloride: 105 mmol/L (ref 101–111)
Creatinine, Ser: 1.22 mg/dL (ref 0.61–1.24)
GFR calc Af Amer: >60 mL/min (ref >60)
GLUCOSE: 94 mg/dL (ref 65–99)
POTASSIUM: 4.6 mmol/L (ref 3.5–5.1)
Sodium: 139 mmol/L (ref 135–145)

## 2017-04-04 LAB — URINALYSIS, MICROSCOPIC (REFLEX)

## 2017-04-04 MED ORDER — KETOROLAC TROMETHAMINE 10 MG PO TABS: 10.0000 mg | ORAL_TABLET | Freq: Four times a day (QID) | ORAL | 0 refills | Status: DC | PRN

## 2017-04-04 MED ORDER — SODIUM CHLORIDE 0.9 % IV BOLUS (SEPSIS)
500.0000 mL | Freq: Once | INTRAVENOUS | Status: AC
  Administered 2017-04-04: 500 mL via INTRAVENOUS

## 2017-04-04 MED ORDER — KETOROLAC TROMETHAMINE 30 MG/ML IJ SOLN
30.0000 mg | Freq: Once | INTRAMUSCULAR | Status: AC
  Administered 2017-04-04: 30 mg via INTRAVENOUS
  Filled 2017-04-04: qty 1

## 2017-04-04 MED ORDER — SODIUM CHLORIDE 0.9 % IV SOLN: 1000.0000 mL | INTRAVENOUS | Status: DC

## 2017-04-04 MED FILL — KETOROLAC 10 MG TABLET: 10 | 4 days supply | Qty: 15 | Fill #0

## 2017-04-04 NOTE — ED Notes (Signed)
Requests to be checked for STD because of a thing he had with his girl and the babysitter. Denies discharge or dysuria

## 2017-04-04 NOTE — ED Provider Notes (Signed)
[facility]   CSN: [id number] Arrival date & time: 04/04/17  1147     History   Chief Complaint Chief Complaint  Patient presents with  . Flank Pain    HPI William Mejia is a 26 y.o. male presenting with one-week history left-sided flank pain.  Patient states that he started having pain last week. He came to the ED, and was diagnosed with a kidney stone. Since his visit to the ED, he has continued to have intermittent, colicky pain on that left side. The pain is not getting worse, but has not improved. He states he is trying not to take the prescribed pain medicine, as he cannot take it while he is at work and has not been using any over-the-counter medicine such as ibuprofen or Tylenol. He reports that recently he feels like he is having increased difficulty urinating as and he feels the stone is obstructing his flow. Additionally he reports some suprapubic discomfort. He denies fevers or chills, but reports some intermittent nausea. He last threw up on Thursday. He denies dysuria. He has increased frequency of urination, but is taking Flomax. He requests that after discharge, he gets a prescription for pain management that it is not as strong as dilaudid. He did not schedule appointment with urology, as he did not think he would need to follow-up with them. Additionally, patient states he wants to be tested for STDs. He states he had unsafe practices, and wants testing. He denies penile discharge, itching, or pain. He denies swelling or pain of the penis or scrotum.  HPI  Past Medical History:  Diagnosis Date  . Anxiety   . Kidney stone   . Substance abuse    meth    Patient Active Problem List   Diagnosis Date Noted  . Chronic low back pain 12/04/2013  . 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 WISDOM TOOTH EXTRACTION         Home Medications    Prior to Admission medications     Medication Sig Start Date End Date Taking? Authorizing Provider  HYDROmorphone (DILAUDID) 4 MG tablet Take 0.5 tablets (2 mg total) by mouth every 4 (four) hours as needed for severe pain. 03/28/17   Molpus, John, MD  ketorolac (TORADOL) 10 MG tablet Take 1 tablet (10 mg total) by mouth every 6 (six) hours as needed for severe pain. 04/04/17   Zanaria Morell, PA-C  ondansetron (ZOFRAN ODT) 8 MG disintegrating tablet Take 1 tablet (8 mg total) by mouth every 8 (eight) hours as needed for nausea or vomiting. 03/28/17   Molpus, John, MD  tamsulosin (FLOMAX) 0.4 MG CAPS capsule Take one tablet daily until stone passes. 03/28/17   Molpus, John, MD    Family History Family History  Problem Relation Age of Onset  . Diabetes Mother     Social History Social History  Substance Use Topics  . Smoking status: Current Every Day Smoker  . Smokeless tobacco: Never Used  . Alcohol use No     Allergies   Patient has no known allergies.   Review of Systems Review of Systems  Constitutional: Negative for chills and fever.  HENT: Negative for sore throat.   Eyes: Negative for photophobia and visual disturbance.  Respiratory: Negative for cough and shortness of breath.   Cardiovascular: Negative for chest pain.  Gastrointestinal: Positive for abdominal pain (L flank and suprapubic). Negative for constipation, diarrhea, nausea and vomiting.  Endocrine: Negative for polydipsia and polyuria.  Genitourinary: Positive for difficulty urinating, flank pain and frequency. Negative for discharge, dysuria, hematuria, penile pain, penile swelling, scrotal swelling and testicular pain.  Musculoskeletal: Negative for back pain, neck pain and neck stiffness.  Skin: Negative for rash.  Neurological: Negative for dizziness and headaches.  Psychiatric/Behavioral: Negative for confusion.     Physical Exam Updated Vital Signs BP 110/62 (BP Location: Left Arm)   Pulse 63   Temp 98 F (36.7 C) (Oral)   Resp  18   Ht 6\' 1"  (1.854 m)   Wt 77.6 kg (171 lb)   SpO2 100%   BMI 22.56 kg/m   Physical Exam  Constitutional: He is oriented to person, place, and time. He appears well-developed and well-nourished. No distress.  HENT:  Head: Normocephalic and atraumatic.  Eyes: Conjunctivae and EOM are normal. Pupils are equal, round, and reactive to light.  Neck: Normal range of motion. Neck supple.  Cardiovascular: Normal rate, regular rhythm, normal heart sounds and intact distal pulses.   Pulmonary/Chest: Effort normal and breath sounds normal. No respiratory distress. He has no wheezes.  Abdominal: Soft. Bowel sounds are normal. He exhibits no distension. There is CVA tenderness. There is no rigidity, no rebound, no tenderness at McBurney's point and negative Murphy's sign. Hernia confirmed negative in the right inguinal area and confirmed negative in the left inguinal area.  Minimal suprapubic tenderness. Positive CVA on L side  Genitourinary: Testes normal and penis normal. Right testis shows no mass, no swelling and no tenderness. Left testis shows no mass, no swelling and no tenderness. No penile tenderness. No discharge found.  Musculoskeletal: Normal range of motion.  Lymphadenopathy: No inguinal adenopathy noted on the right or left side.  Neurological: He is alert and oriented to person, place, and time.  Skin: Skin is warm and dry.  Psychiatric: He has a normal mood and affect.  Nursing note and vitals reviewed.    ED Treatments / Results  Labs (all labs ordered are listed, but only abnormal results are displayed) Labs Reviewed  URINALYSIS, ROUTINE W REFLEX MICROSCOPIC - Abnormal; Notable for the following:       Result Value   APPearance TURBID (*)    All other components within normal limits  URINALYSIS, MICROSCOPIC (REFLEX) - Abnormal; Notable for the following:    Bacteria, UA RARE (*)    Squamous Epithelial / LPF 0-5 (*)    All other components within normal limits  BASIC  METABOLIC PANEL  CBC  RPR  HIV ANTIBODY (ROUTINE TESTING)  GC/CHLAMYDIA PROBE AMP ([place]    EKG  EKG Interpretation None       Radiology No results found.  Procedures Procedures (including critical care time)  Medications Ordered in ED Medications  sodium chloride 0.9 % bolus 500 mL (0 mLs Intravenous Stopped 04/04/17 1307)  ketorolac (TORADOL) 30 MG/ML injection 30 mg (30 mg Intravenous Given 04/04/17 1237)     Initial Impression / Assessment and Plan / ED Course  I have reviewed the triage vital signs and the nursing notes.  Pertinent labs & imaging results that were available during my care of the patient were reviewed by me and considered in my medical decision making (see chart for details).     On initial assessment, patient is lying in bed and appears in no distress. He currently has minimal pain, but is concerned that his pain has not gone away yet. Additionally, he is concerned about his  urinary symptoms and concerned about an STD. Will order basic labs and UA. Will start IVF and give ketorolac for pain management. Will order STD testing per patient request.  UA was clear, and labs reassuring. Patient states that his pain is improved with the ketorolac. Urged importance of follow-up with urology and continuation of hydration. Will discharge patient with prescription for tramadol, which he can use instead of Dilaudid at home. Discussed at length how a kidney stone works, and that he will likely continue to have pain until the stone is passed. Discussed that Chlamydia and gonorrhea labs will not return for 3 days, and he will get a call only if they're positive. Discussed return precautions. Patient states he understands and agrees to plan.  Final Clinical Impressions(s) / ED Diagnoses   Final diagnoses:  Flank pain  Kidney stone    New Prescriptions Discharge Medication List as of 04/04/2017  1:10 PM    START taking these medications   Details    ketorolac (TORADOL) 10 MG tablet Take 1 tablet (10 mg total) by mouth every 6 (six) hours as needed for severe pain., Starting Tue 04/04/2017, Print         [place], [place], PA-C 04/04/17 1654    Jacalyn Lefevre, MD 04/05/17 [phone]

## 2017-04-04 NOTE — Discharge Instructions (Signed)
Take Toradol as needed for severe pain. You may use Tylenol as needed for mild pain. Follow-up with the urologist in 1 week for further evaluation kidney stone. Continue to drink lots of water. You should expect to have pain until the stone passes.  Return to the ER if you develop fevers, chills, significantly worsening pain, or any new or worsening symptoms.

## 2017-04-04 NOTE — ED Triage Notes (Signed)
Pt states he was dx here with kidney stone-c/o cont'd left flank pain and difficult urination-NAD-steady gait

## 2017-04-05 LAB — GC/CHLAMYDIA PROBE AMP (~~LOC~~) NOT AT ARMC
Chlamydia: NEGATIVE
NEISSERIA GONORRHEA: NEGATIVE

## 2017-04-05 LAB — HIV ANTIBODY (ROUTINE TESTING W REFLEX): HIV SCREEN 4TH GENERATION: NONREACTIVE

## 2017-04-05 LAB — RPR: RPR: NONREACTIVE

## 2017-10-25 ENCOUNTER — Emergency Department (HOSPITAL_BASED_OUTPATIENT_CLINIC_OR_DEPARTMENT_OTHER): Payer: Self-pay

## 2017-10-25 ENCOUNTER — Emergency Department (HOSPITAL_BASED_OUTPATIENT_CLINIC_OR_DEPARTMENT_OTHER)
Admission: EM | Admit: 2017-10-25 | Discharge: 2017-10-26 | Disposition: A | Discharge status: Home / Self Care | Payer: Self-pay | Attending: Emergency Medicine | Admitting: Emergency Medicine

## 2017-10-25 ENCOUNTER — Encounter (HOSPITAL_BASED_OUTPATIENT_CLINIC_OR_DEPARTMENT_OTHER): Payer: Self-pay

## 2017-10-25 ENCOUNTER — Other Ambulatory Visit: Payer: Self-pay

## 2017-10-25 DIAGNOSIS — R69 Illness, unspecified: Secondary | ICD-10-CM

## 2017-10-25 DIAGNOSIS — J111 Influenza due to unidentified influenza virus with other respiratory manifestations: Secondary | ICD-10-CM | POA: Insufficient documentation

## 2017-10-25 DIAGNOSIS — F17290 Nicotine dependence, other tobacco product, uncomplicated: Secondary | ICD-10-CM | POA: Insufficient documentation

## 2017-10-25 LAB — CBC WITH DIFFERENTIAL/PLATELET
BASOS ABS: 0.1 10*3/uL (ref 0.0–0.1)
Basophils Relative: 1 %
EOS ABS: 0.1 10*3/uL (ref 0.0–0.7)
Eosinophils Relative: 1 %
HCT: 42.9 % (ref 39.0–52.0)
Hemoglobin: 14.8 g/dL (ref 13.0–17.0)
Lymphocytes Relative: 10 %
Lymphs Abs: 0.8 10*3/uL (ref 0.7–4.0)
MCH: 30.8 pg (ref 26.0–34.0)
MCHC: 34.5 g/dL (ref 30.0–36.0)
MCV: 89.2 fL (ref 78.0–100.0)
Monocytes Absolute: 0.7 10*3/uL (ref 0.1–1.0)
Monocytes Relative: 8 %
NEUTROS PCT: 80 %
Neutro Abs: 6.4 10*3/uL (ref 1.7–7.7)
PLATELETS: 267 10*3/uL (ref 150–400)
RBC: 4.81 MIL/uL (ref 4.22–5.81)
RDW: 13.4 % (ref 11.5–15.5)
WBC: 8 10*3/uL (ref 4.0–10.5)

## 2017-10-25 LAB — COMPREHENSIVE METABOLIC PANEL
ALBUMIN: 4.7 g/dL (ref 3.5–5.0)
ALT: 28 U/L (ref 17–63)
AST: 37 U/L (ref 15–41)
Alkaline Phosphatase: 66 U/L (ref 38–126)
Anion gap: 12 (ref 5–15)
BUN: 15 mg/dL (ref 6–20)
CHLORIDE: 101 mmol/L (ref 101–111)
CO2: 22 mmol/L (ref 22–32)
Calcium: 9.5 mg/dL (ref 8.9–10.3)
Creatinine, Ser: 1.37 mg/dL — ABNORMAL HIGH (ref 0.61–1.24)
GFR calc Af Amer: >60 mL/min (ref >60)
GFR calc non Af Amer: >60 mL/min (ref >60)
GLUCOSE: 122 mg/dL — AB (ref 65–99)
POTASSIUM: 3.6 mmol/L (ref 3.5–5.1)
SODIUM: 135 mmol/L (ref 135–145)
Total Bilirubin: 1.1 mg/dL (ref 0.3–1.2)
Total Protein: 7.8 g/dL (ref 6.5–8.1)

## 2017-10-25 LAB — LIPASE, BLOOD: LIPASE: 23 U/L (ref 11–51)

## 2017-10-25 MED ORDER — ONDANSETRON HCL 8 MG PO TABS: 4.0000 mg | ORAL_TABLET | Freq: Once | ORAL | Status: DC

## 2017-10-25 MED ORDER — SODIUM CHLORIDE 0.9 % IV BOLUS (SEPSIS)
1000.0000 mL | Freq: Once | INTRAVENOUS | Status: AC
  Administered 2017-10-25: 1000 mL via INTRAVENOUS

## 2017-10-25 MED ORDER — OSELTAMIVIR PHOSPHATE 75 MG PO CAPS
75.0000 mg | ORAL_CAPSULE | Freq: Once | ORAL | Status: AC
  Administered 2017-10-25: 75 mg via ORAL
  Filled 2017-10-25: qty 1

## 2017-10-25 MED ORDER — ONDANSETRON 4 MG PO TBDP
4.0000 mg | ORAL_TABLET | Freq: Once | ORAL | Status: AC
Start: 2017-10-25 — End: 2017-10-25
  Administered 2017-10-25: 4 mg via ORAL
  Filled 2017-10-25: qty 1

## 2017-10-25 MED ORDER — ONDANSETRON HCL 4 MG/2ML IJ SOLN
4.0000 mg | Freq: Once | INTRAMUSCULAR | Status: AC
  Administered 2017-10-25: 4 mg via INTRAVENOUS
  Filled 2017-10-25: qty 2

## 2017-10-25 MED ORDER — KETOROLAC TROMETHAMINE 30 MG/ML IJ SOLN
30.0000 mg | Freq: Once | INTRAMUSCULAR | Status: AC
  Administered 2017-10-25: 30 mg via INTRAVENOUS
  Filled 2017-10-25: qty 1

## 2017-10-25 NOTE — ED Provider Notes (Signed)
[facility] EMERGENCY DEPARTMENT Provider Note   CSN: [id number] Arrival date & time: 10/25/17  2043     History   Chief Complaint Chief Complaint  Patient presents with  . Vomiting    HPI William Mejia is a 27 y.o. male.  HPI Patient presents with acute onset fever, nonproductive cough, diffuse body aches, sinus pressure and vomiting which started yesterday.  Denies any abdominal pain.  He is having some bilateral lower rib pain with breathing and coughing.  He has been taking ibuprofen.  Denies any abdominal pain.  No diarrhea.  Son with "sinus infection" recently Past Medical History:  Diagnosis Date  . Anxiety   . Kidney stone   . Substance abuse [facility])    meth    Patient Active Problem List   Diagnosis Date Noted  . Chronic low back pain 12/04/2013  . 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 WISDOM TOOTH EXTRACTION         Home Medications    Prior to Admission medications   Medication Sig Start Date End Date Taking? Authorizing Provider  promethazine (PHENERGAN) 25 MG tablet Take 1 tablet (25 mg total) by mouth every 6 (six) hours as needed for nausea or vomiting. 10/26/17   Ward, Layla Maw, DO    Family History Family History  Problem Relation Age of Onset  . Diabetes Mother     Social History Social History   Tobacco Use  . Smoking status: Current Every Day Smoker  . Smokeless tobacco: Current User    Types: Chew  Substance Use Topics  . Alcohol use: No  . Drug use: Yes    Types: Marijuana     Allergies   Patient has no known allergies.   Review of Systems Review of Systems  Constitutional: Positive for chills, fatigue and fever.  HENT: Positive for congestion and sinus pressure. Negative for sore throat.   Eyes: Negative for visual disturbance.  Respiratory: Positive for cough and shortness of breath.   Cardiovascular: Negative for chest pain, palpitations and leg  swelling.  Gastrointestinal: Positive for nausea and vomiting. Negative for abdominal pain, constipation and diarrhea.  Genitourinary: Negative for dysuria, flank pain, frequency and hematuria.  Musculoskeletal: Positive for back pain and myalgias. Negative for neck pain and neck stiffness.  Skin: Negative for rash and wound.  Neurological: Negative for dizziness, weakness, light-headedness, numbness and headaches.  Psychiatric/Behavioral: The patient is nervous/anxious.   All other systems reviewed and are negative.    Physical Exam Updated Vital Signs BP (!) 112/59 (BP Location: Left Arm)   Pulse 99   Temp 99.5 F (37.5 C) (Oral)   Resp 18   Ht 6\' 1"  (1.854 m)   Wt 81.6 kg (180 lb)   SpO2 98%   BMI 23.75 kg/m   Physical Exam  Constitutional: He is oriented to person, place, and time. He appears well-developed and well-nourished. No distress.  HENT:  Head: Normocephalic and atraumatic.  Mouth/Throat: Oropharynx is clear and moist. No oropharyngeal exudate.  No sinus tenderness to percussion.  Eyes: EOM are normal. Pupils are equal, round, and reactive to light.  Neck: Normal range of motion. Neck supple.  No meningismus.  Cardiovascular: Normal rate and regular rhythm. Exam reveals no gallop and no friction rub.  No murmur heard. Pulmonary/Chest: Effort normal. He has wheezes.  Few scattered expiratory wheezes  Abdominal: Soft. Bowel sounds are normal. He exhibits no distension. There is no  tenderness. There is no rebound and no guarding.  Musculoskeletal: Normal range of motion. He exhibits no edema or tenderness.  No midline thoracic or lumbar tenderness.  No CVA tenderness.  No lower extremity swelling, asymmetry or tenderness.  Distal pulses are 2+  Lymphadenopathy:    He has no cervical adenopathy.  Neurological: He is alert and oriented to person, place, and time.  5/5 motor in all extremities.  Sensation fully intact.  Skin: Skin is warm and dry. No rash noted. He  is not diaphoretic. No erythema.  Psychiatric: He has a normal mood and affect. His behavior is normal.  Nursing note and vitals reviewed.    ED Treatments / Results  Labs (all labs ordered are listed, but only abnormal results are displayed) Labs Reviewed  COMPREHENSIVE METABOLIC PANEL - Abnormal; Notable for the following components:      Result Value   Glucose, Bld 122 (*)    Creatinine, Ser 1.37 (*)    All other components within normal limits  CBC WITH DIFFERENTIAL/PLATELET  LIPASE, BLOOD    EKG  EKG Interpretation None       Radiology Dg Chest 2 View  Result Date: 10/25/2017 CLINICAL DATA:  Body aches for 2 days EXAM: CHEST  2 VIEW COMPARISON:  None. FINDINGS: Normal heart size. Clear lungs. Mild scoliosis of the thoracic spine. No pneumothorax. No pleural effusion. IMPRESSION: No active cardiopulmonary disease. Electronically Signed   By: Jolaine ClickArthur  Hoss M.D.   On: 10/25/2017 21:50    Procedures Procedures (including critical care time)  Medications Ordered in ED Medications  ondansetron (ZOFRAN-ODT) disintegrating tablet 4 mg (4 mg Oral Given 10/25/17 2055)  ondansetron (ZOFRAN) injection 4 mg (4 mg Intravenous Given 10/25/17 2154)  sodium chloride 0.9 % bolus 1,000 mL (0 mLs Intravenous Stopped 10/25/17 2345)  ketorolac (TORADOL) 30 MG/ML injection 30 mg (30 mg Intravenous Given 10/25/17 2249)  oseltamivir (TAMIFLU) capsule 75 mg (75 mg Oral Given 10/25/17 2254)  promethazine (PHENERGAN) tablet 25 mg (25 mg Oral Given 10/26/17 0042)     Initial Impression / Assessment and Plan / ED Course  I have reviewed the triage vital signs and the nursing notes.  Pertinent labs & imaging results that were available during my care of the patient were reviewed by me and considered in my medical decision making (see chart for details).     Normal chest x-ray.  Normal white blood cell count.  Suspect flulike illness.  Will start on Tamiflu give IV fluids.  Signed out to oncoming  EMP.   Final Clinical Impressions(s) / ED Diagnoses   Final diagnoses:  Influenza-like illness    ED Discharge Orders        Ordered    promethazine (PHENERGAN) 25 MG tablet  Every 6 hours PRN     10/26/17 0037       Loren RacerYelverton, Caidence Higashi, MD 10/27/17 [phone]

## 2017-10-25 NOTE — ED Triage Notes (Addendum)
C/o n/v, body aches day 2-also c/o SOB x today-last dose ibuprofen "earlier today"-pt anxious-refused to wear mask stating "I can't breathe with that thing on"

## 2017-10-25 NOTE — ED Notes (Signed)
Vomited small amt of emesis

## 2017-10-25 NOTE — ED Notes (Signed)
Patient transported to X-ray 

## 2017-10-25 NOTE — ED Notes (Signed)
Instructed to drink fluids, has water at bedside

## 2017-10-26 MED ORDER — PROMETHAZINE HCL 25 MG PO TABS: 25.0000 mg | ORAL_TABLET | Freq: Four times a day (QID) | ORAL | 0 refills | Status: DC | PRN

## 2017-10-26 MED ORDER — PROMETHAZINE HCL 25 MG PO TABS
25.0000 mg | ORAL_TABLET | Freq: Once | ORAL | Status: AC
  Administered 2017-10-26: 25 mg via ORAL
  Filled 2017-10-26: qty 1

## 2017-10-26 NOTE — ED Provider Notes (Signed)
12:35 AM  Assumed care from Dr. Ranae PalmsYelverton.  Patient is a 27 year old male who presented to the with flulike illness.  Patient combines a fever, body aches, congestion, vomiting.  Plan was to reevaluate after IV fluids and medications.  Vital signs have improved.  Abdominal exam benign.  Patient reports feeling better.  Recommend alternating Tylenol and Motrin for fever and pain.  Recommend increase fluid intake at home and will discharge with prescription of Phenergan.  Given outpatient PCP follow-up information.  Patient and significant other comfortable with this plan.  All questions have been answered.   Ward, Layla MawKristen N, DO 10/26/17 [phone]

## 2017-10-26 NOTE — Discharge Instructions (Signed)
You may alternate Tylenol 1000 mg every 6 hours as needed for pain and fever and Ibuprofen 800 mg every 8 hours as needed for pain and fever.  Please take Ibuprofen with food.    To find a primary care or specialty doctor please call [phone] or [phone] to access "[facility] Find a Doctor Service."  You may also go on the [facility] website at [email or web address]  There are also multiple [facility], [facility], [facility] and [facility] practices throughout the [place] that are frequently accepting new patients. You may find a clinic that is close to your home and contact them.  [facility] -  [address] [place] [phone]   [facility] -  [address] [place] [address] [phone]   [facility] [phone]  [place] [phone]

## 2018-03-14 ENCOUNTER — Emergency Department (HOSPITAL_BASED_OUTPATIENT_CLINIC_OR_DEPARTMENT_OTHER): Payer: Self-pay

## 2018-03-14 ENCOUNTER — Other Ambulatory Visit: Payer: Self-pay

## 2018-03-14 ENCOUNTER — Emergency Department (HOSPITAL_BASED_OUTPATIENT_CLINIC_OR_DEPARTMENT_OTHER)
Admission: EM | Admit: 2018-03-14 | Discharge: 2018-03-14 | Disposition: A | Discharge status: Home / Self Care | Payer: Self-pay | Attending: Emergency Medicine | Admitting: Emergency Medicine

## 2018-03-14 ENCOUNTER — Encounter (HOSPITAL_BASED_OUTPATIENT_CLINIC_OR_DEPARTMENT_OTHER): Payer: Self-pay

## 2018-03-14 DIAGNOSIS — F17200 Nicotine dependence, unspecified, uncomplicated: Secondary | ICD-10-CM | POA: Insufficient documentation

## 2018-03-14 DIAGNOSIS — J069 Acute upper respiratory infection, unspecified: Secondary | ICD-10-CM | POA: Insufficient documentation

## 2018-03-14 DIAGNOSIS — F419 Anxiety disorder, unspecified: Secondary | ICD-10-CM | POA: Insufficient documentation

## 2018-03-14 DIAGNOSIS — R05 Cough: Secondary | ICD-10-CM | POA: Insufficient documentation

## 2018-03-14 HISTORY — DX: Anogenital (venereal) warts: A63.0

## 2018-03-14 LAB — RAPID STREP SCREEN (MED CTR MEBANE ONLY): STREPTOCOCCUS, GROUP A SCREEN (DIRECT): NEGATIVE

## 2018-03-14 MED ORDER — BENZONATATE 100 MG PO CAPS: 100.0000 mg | ORAL_CAPSULE | Freq: Three times a day (TID) | ORAL | 0 refills | Status: DC

## 2018-03-14 MED ORDER — NAPROXEN 500 MG PO TABS: 500.0000 mg | ORAL_TABLET | Freq: Two times a day (BID) | ORAL | 0 refills | Status: DC

## 2018-03-14 MED ORDER — FLUTICASONE PROPIONATE 50 MCG/ACT NA SUSP: 1.0000 | Freq: Every day | NASAL | 0 refills | Status: DC

## 2018-03-14 NOTE — ED Notes (Signed)
NAD at this time. Pt is stable and going home.  

## 2018-03-14 NOTE — ED Provider Notes (Signed)
[facility] EMERGENCY DEPARTMENT Provider Note   CSN: [id number] Arrival date & time: 03/14/18  1549     History   Chief Complaint Chief Complaint  Patient presents with  . Sore Throat    HPI William Mejia is a 27 y.o. male with a hx of tobacco abuse, anxiety, nephrolithiasis, and substance abuse who presents to the ED with complaints of URI sxs that started yesterday. Patient states he is having congestion, rhinorrhea, sinus pain/pressure, headache (gradual onset, steady progression, similar to previous), sore throat, and productive cough with green mucous sputum. States it is somewhat hard to breathe- states he is having trouble getting air through his nose. He states sxs are constant. Discomfort is a 5/10 in severity. No specific alleviating/aggravating factors. Tried ibuprofen without much change. Denies fever, chills, dysphagia, vomiting, chest pain, hemoptysis, or recent tic exposure/outdoor activities.   HPI  Past Medical History:  Diagnosis Date  . Anxiety   . Kidney stone   . 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 Chronic low back pain 12/04/2013  . 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 WISDOM TOOTH EXTRACTION          Home Medications    Prior to Admission medications   Medication Sig Start Date End Date Taking? Authorizing Provider  promethazine (PHENERGAN) 25 MG tablet Take 1 tablet (25 mg total) by mouth every 6 (six) hours as needed for nausea or vomiting. 10/26/17   Ward, Layla MawKristen N, DO    Family History Family History  Problem Relation Age of Onset  . Diabetes Mother     Social History Social History   Tobacco Use  . Smoking status: Current Every Day Smoker  . Smokeless tobacco: Current User    Types: Chew  Substance Use Topics  . Alcohol use: No  . Drug use: Yes    Types: Marijuana     Allergies   Patient has no known  allergies.   Review of Systems Review of Systems  Constitutional: Negative for chills and fever.  HENT: Positive for congestion, rhinorrhea, sinus pressure, sinus pain and sore throat. Negative for drooling, ear discharge, ear pain, trouble swallowing and voice change.   Respiratory: Positive for cough and shortness of breath ("hard to breathe through my nose"). Negative for wheezing.   Cardiovascular: Negative for chest pain.  Gastrointestinal: Negative for abdominal pain and vomiting.  Skin: Negative for rash.     Physical Exam Updated Vital Signs BP 123/75 (BP Location: Left Arm)   Pulse (!) 104   Temp 99 F (37.2 C) (Oral)   Resp 18   Ht 6\' 1"  (1.854 m)   Wt 83.9 kg (185 lb)   SpO2 100%   BMI 24.41 kg/m   Physical Exam  Constitutional: He appears well-developed and well-nourished.  Non-toxic appearance. No distress.  HENT:  Head: Normocephalic and atraumatic.  Right Ear: Tympanic membrane is not perforated, not erythematous, not retracted and not bulging.  Left Ear: Tympanic membrane is not perforated, not erythematous, not retracted and not bulging.  Nose: Mucosal edema (and congestion) present. Right sinus exhibits no maxillary sinus tenderness and no frontal sinus tenderness. Left sinus exhibits no maxillary sinus tenderness and no frontal sinus tenderness.  Mouth/Throat: Uvula is midline. Posterior oropharyngeal erythema present. No oropharyngeal exudate.  Patient is tolerating his own secretions without difficulty. No trismus. No drooling. No  hot potato voice. Submandibular compartment is soft.   Eyes: Pupils are equal, round, and reactive to light. Conjunctivae and EOM are normal. Right eye exhibits no discharge. Left eye exhibits no discharge.  Neck: Normal range of motion and full passive range of motion without pain. Neck supple. No neck rigidity. Normal range of motion present.  Cardiovascular: Regular rhythm. Tachycardia present.  No murmur  heard. Pulmonary/Chest: Effort normal. No respiratory distress. He has no wheezes. He has no rhonchi. He has no rales.  Abdominal: Soft. He exhibits no distension. There is no tenderness.  Lymphadenopathy:    He has no cervical adenopathy.  Neurological: He is alert.  Clear speech.   Skin: Skin is warm and dry. No rash noted.  Psychiatric: He has a normal mood and affect. His behavior is normal. Thought content normal.  Nursing note and vitals reviewed.   ED Treatments / Results  Labs (all labs ordered are listed, but only abnormal results are displayed) Results for orders placed or performed during the hospital encounter of 03/14/18  Rapid Strep Screen ([facility] ONLY)  Result Value Ref Range   Streptococcus, Group A Screen (Direct) NEGATIVE NEGATIVE   EKG None  Radiology Dg Chest 2 View  Result Date: 03/14/2018 CLINICAL DATA:  Cough and chest pain for 2 days. EXAM: CHEST - 2 VIEW COMPARISON:  10/25/2017 FINDINGS: The cardiomediastinal silhouette is unremarkable. There is no evidence of focal airspace disease, pulmonary edema, suspicious pulmonary nodule/mass, pleural effusion, or pneumothorax. No acute bony abnormalities are identified. IMPRESSION: No active cardiopulmonary disease. Electronically Signed   By: Harmon Pier M.D.   On: 03/14/2018 17:04    Procedures Procedures (including critical care time)  Medications Ordered in ED Medications - No data to display   Initial Impression / Assessment and Plan / ED Course  I have reviewed the triage vital signs and the nursing notes.  Pertinent labs & imaging results that were available during my care of the patient were reviewed by me and considered in my medical decision making (see chart for details).   Patient presents to the ED with URI sxs that started yesterday. Patient nontoxic appearing, in no apparent distress, mild initial tachycardia at 104, vitals otherwise WNL. Patient lungs CTA, no evidence of respiratory distress,  CXR negative for infiltrate, doubt pneumonia. No wheezing on exam. His sxs have been ongoing < 7 days, no sinus tenderness, afebrile, doubt acute bacterial sinusitis. Rapid strep is negative, culture pending, no evidence of RPA/PTA on exam. Headache gradual onset, steady progression, similar to previous, no meningeal signs. No recent tic exposures. At this time suspect viral vs. Allergic, will treat supportively with Flonase, Tessalon, and Naproxen with PCP follow up . I discussed results, treatment plan, need for PCP follow-up, and return precautions with the patient. Provided opportunity for questions, patient confirmed understanding and is in agreement with plan.   Final Clinical Impressions(s) / ED Diagnoses   Final diagnoses:  URI with cough and congestion    ED Discharge Orders        Ordered    naproxen (NAPROSYN) 500 MG tablet  2 times daily     03/14/18 1711    fluticasone (FLONASE) 50 MCG/ACT nasal spray  Daily     03/14/18 1711    benzonatate (TESSALON) 100 MG capsule  Every 8 hours     03/14/18 1711       Petrucelli, [place] R, PA-C 03/14/18 1736    Gwyneth Sprout, MD 03/14/18 2338

## 2018-03-14 NOTE — ED Triage Notes (Signed)
Pt c/o nasal congestion, sore throat and headache since yesterday, unrelieved with a dose of OTC meds yesterday

## 2018-03-14 NOTE — Discharge Instructions (Addendum)
You were seen in the ER for an upper respiratory infection. Your strep test was negative. Your chest xray was normal, no pneumonia. WE suspect your symptoms are allergy or virus related and will be treating them with the following medicines:   - Flonase to be used 1 spray in each nostril daily.  This medication is used to treat your congestion.  -Tessalon can be taken once every 8 hours as needed.  This medication is used to treat your cough.  -Naproxen is a nonsteroidal anti-inflammatory medication that will help with pain and swelling. Be sure to take this medication as prescribed with food, 1 pill every 12 hours,  It should be taken with food, as it can cause stomach upset, and more seriously, stomach bleeding. Do not take other nonsteroidal anti-inflammatory medications with this such as Advil, Motrin, or Aleve. You may take tylenol per over the counter dosing with these medications safely.  We have prescribed you new medication(s) today. Discuss the medications prescribed today with your pharmacist as they can have adverse effects and interactions with your other medicines including over the counter and prescribed medications. Seek medical evaluation if you start to experience new or abnormal symptoms after taking one of these medicines, seek care immediately if you start to experience difficulty breathing, feeling of your throat closing, facial swelling, or rash as these could be indications of a more serious allergic reaction  Additionally please try to stop smoking this will make your condition worse and is a risk for your overall health.   You will need to follow-up with your primary care provider in 1 week if your symptoms have not improved.  If you do not have a primary care provider one is provided in your discharge instructions.  Return to the emergency department for any new or worsening symptoms including but not limited to fever, difficulty breathing, chest pain, passing out, or any other  concerns.

## 2018-03-17 LAB — CULTURE, GROUP A STREP (THRC)

## 2018-06-29 ENCOUNTER — Emergency Department (HOSPITAL_BASED_OUTPATIENT_CLINIC_OR_DEPARTMENT_OTHER)
Admission: EM | Admit: 2018-06-29 | Discharge: 2018-06-29 | Disposition: A | Discharge status: Home / Self Care | Payer: Self-pay | Attending: Emergency Medicine | Admitting: Emergency Medicine

## 2018-06-29 ENCOUNTER — Encounter (HOSPITAL_BASED_OUTPATIENT_CLINIC_OR_DEPARTMENT_OTHER): Payer: Self-pay | Admitting: *Deleted

## 2018-06-29 ENCOUNTER — Emergency Department (HOSPITAL_BASED_OUTPATIENT_CLINIC_OR_DEPARTMENT_OTHER): Payer: Self-pay

## 2018-06-29 ENCOUNTER — Other Ambulatory Visit: Payer: Self-pay

## 2018-06-29 DIAGNOSIS — F17200 Nicotine dependence, unspecified, uncomplicated: Secondary | ICD-10-CM | POA: Insufficient documentation

## 2018-06-29 DIAGNOSIS — Z79899 Other long term (current) drug therapy: Secondary | ICD-10-CM | POA: Insufficient documentation

## 2018-06-29 DIAGNOSIS — N451 Epididymitis: Secondary | ICD-10-CM | POA: Insufficient documentation

## 2018-06-29 LAB — URINALYSIS, ROUTINE W REFLEX MICROSCOPIC
Bilirubin Urine: NEGATIVE
Glucose, UA: NEGATIVE mg/dL
KETONES UR: 15 mg/dL — AB
NITRITE: NEGATIVE
PROTEIN: 30 mg/dL — AB
Specific Gravity, Urine: >1.03 — ABNORMAL HIGH (ref 1.005–1.030)
pH: 6 (ref 5.0–8.0)

## 2018-06-29 LAB — URINALYSIS, MICROSCOPIC (REFLEX)

## 2018-06-29 MED ORDER — HYDROCODONE-ACETAMINOPHEN 5-325 MG PO TABS
1.0000 | ORAL_TABLET | Freq: Once | ORAL | Status: AC
  Administered 2018-06-29: 1 via ORAL
  Filled 2018-06-29: qty 1

## 2018-06-29 MED ORDER — CEFTRIAXONE SODIUM 250 MG IJ SOLR
250.0000 mg | Freq: Once | INTRAMUSCULAR | Status: AC
Start: 2018-06-29 — End: 2018-06-29
  Administered 2018-06-29: 250 mg via INTRAMUSCULAR
  Filled 2018-06-29: qty 250

## 2018-06-29 MED ORDER — DOXYCYCLINE HYCLATE 100 MG PO CAPS: 100.0000 mg | ORAL_CAPSULE | Freq: Two times a day (BID) | ORAL | 0 refills | Status: AC

## 2018-06-29 MED ORDER — LIDOCAINE HCL (PF) 1 % IJ SOLN
INTRAMUSCULAR | Status: AC
  Administered 2018-06-29: 0.9 mL
  Filled 2018-06-29: qty 5

## 2018-06-29 MED ORDER — MELOXICAM 15 MG PO TABS: 15.0000 mg | ORAL_TABLET | Freq: Every day | ORAL | 0 refills | Status: AC

## 2018-06-29 MED ORDER — DOXYCYCLINE HYCLATE 100 MG PO CAPS: 100.0000 mg | ORAL_CAPSULE | Freq: Two times a day (BID) | ORAL | 0 refills | Status: DC

## 2018-06-29 NOTE — ED Triage Notes (Signed)
Right testicle is swollen and painful.

## 2018-06-29 NOTE — Discharge Instructions (Addendum)
You may alternate ibuprofen or Tylenol for your pain. I have prescribed antibiotics for your epididymitis please take 1 tablet today for the next 10 days. If your symptoms worsen or you experience any fever please return to the ED for reevaluation.

## 2018-06-29 NOTE — ED Provider Notes (Signed)
[facility] EMERGENCY DEPARTMENT Provider Note   CSN: [id number] Arrival date & time: 06/29/18  1518     History   Chief Complaint Chief Complaint  Patient presents with  . Testicle Pain    HPI William Mejia is a 27 y.o. male.  27 y/o male with a PMH of substance abuse, genital warts presents to the ED with a chief complaint of right testicle swelling x 2 days. He reports swelling and pain to his right testicle and describes it as "twice the size" . He also reports mild pain with urination, but denies any hematuria.  Patient has had some prolonged sitting 2 to 3 days in the hospital as his significant other was recently in the hospital.  He is currently sexually active and states he has had some white cloudy penile discharge.  He denies any notes like this 1.  He has tried some cold compresses along with ibuprofen but states there is no relieving symptoms.  Denies any fever, current history of IV drug use.     Past Medical History:  Diagnosis Date  . Anxiety   . Genital warts   . Kidney stone   . 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 Chronic low back pain 12/04/2013  . 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 WISDOM TOOTH EXTRACTION          Home Medications    Prior to Admission medications   Medication Sig Start Date End Date Taking? Authorizing Provider  benzonatate (TESSALON) 100 MG capsule Take 1 capsule (100 mg total) by mouth every 8 (eight) hours. 03/14/18   Petrucelli, Samantha R, PA-C  doxycycline (VIBRAMYCIN) 100 MG capsule Take 1 capsule (100 mg total) by mouth 2 (two) times daily for 10 days. 06/29/18 07/09/18  Claude MangesSoto, Ebubechukwu Jedlicka, PA-C  fluticasone (FLONASE) 50 MCG/ACT nasal spray Place 1 spray into both nostrils daily. 03/14/18   Petrucelli, Pleas KochSamantha R, PA-C  meloxicam (MOBIC) 15 MG tablet Take 1 tablet (15 mg total) by mouth daily for 7 days.  06/29/18 07/06/18  Claude MangesSoto, Mishal Probert, PA-C  naproxen (NAPROSYN) 500 MG tablet Take 1 tablet (500 mg total) by mouth 2 (two) times daily. 03/14/18   Petrucelli, Pleas KochSamantha R, PA-C  promethazine (PHENERGAN) 25 MG tablet Take 1 tablet (25 mg total) by mouth every 6 (six) hours as needed for nausea or vomiting. 10/26/17   Ward, Layla MawKristen N, DO    Family History Family History  Problem Relation Age of Onset  . Diabetes Mother     Social History Social History   Tobacco Use  . Smoking status: Current Every Day Smoker  . Smokeless tobacco: Current User    Types: Chew  Substance Use Topics  . Alcohol use: No  . Drug use: Yes    Types: Marijuana     Allergies   Patient has no known allergies.   Review of Systems Review of Systems  Constitutional: Negative for fever.  Genitourinary: Positive for discharge (white cloudy), scrotal swelling (right side) and testicular pain. Negative for flank pain, genital sores, hematuria, penile pain and penile swelling.  All other systems reviewed and are negative.    Physical Exam Updated Vital Signs BP 123/87   Pulse (!) 112   Temp 98.1 F (36.7 C) (Oral)   Resp 18   Ht 6\' 1"  (1.854 m)   Wt 83.9 kg  SpO2 100%   BMI 24.41 kg/m   Physical Exam  Constitutional: He is oriented to person, place, and time. He appears well-developed and well-nourished.  HENT:  Head: Normocephalic and atraumatic.  Neck: Normal range of motion. Neck supple.  Cardiovascular: Normal heart sounds.  Pulmonary/Chest: Effort normal and breath sounds normal. He has no wheezes.  Abdominal: Soft. Bowel sounds are normal. There is no tenderness.  Genitourinary: Cremasteric reflex is present. Right testis shows swelling and tenderness. Cremasteric reflex is not absent on the right side. Left testis shows no swelling and no tenderness. Cremasteric reflex is not absent on the left side. Circumcised. No penile tenderness. Discharge found.  Genitourinary Comments: There is clear penile  discharge.  Denies to palpation along the epididymitis of the right testicle, swelling present.  Chaperone by Melina Copa.   Neurological: He is alert and oriented to person, place, and time.  Skin: Skin is warm and dry. No rash noted.  No rash, genital sores, genital ulcers present on exam.  Nursing note and vitals reviewed.    ED Treatments / Results  Labs (all labs ordered are listed, but only abnormal results are displayed) Labs Reviewed  URINALYSIS, ROUTINE W REFLEX MICROSCOPIC - Abnormal; Notable for the following components:      Result Value   APPearance CLOUDY (*)    Specific Gravity, Urine >1.030 (*)    Hgb urine dipstick LARGE (*)    Ketones, ur 15 (*)    Protein, ur 30 (*)    Leukocytes, UA MODERATE (*)    All other components within normal limits  URINALYSIS, MICROSCOPIC (REFLEX) - Abnormal; Notable for the following components:   Bacteria, UA RARE (*)    All other components within normal limits  GC/CHLAMYDIA PROBE AMP ([place]) NOT AT [employer]    EKG None  Radiology US Scrotum W/doppler  Result Date: 06/29/2018 CLINICAL DATA:  Right-sided scrotal pain and swelling since Wednesday. EXAM: SCROTAL ULTRASOUND DOPPLER ULTRASOUND OF THE TESTICLES TECHNIQUE: Complete ultrasound examination of the testicles, epididymis, and other scrotal structures was performed. Color and spectral Doppler ultrasound were also utilized to evaluate blood flow to the testicles. COMPARISON:  None. FINDINGS: Right testicle Measurements: 5.0 x 2.4 x 3.3 cm. Symmetric and homogeneous echotexture without focal lesion. Few tiny scattered calcifications are noted. Patent arterial and venous blood flow. Left testicle Measurements: 5.2 x 2.2 x 2.7 cm. Symmetric and homogeneous echotexture without focal lesion. Few tiny scattered calcifications are noted. Patent arterial and venous blood flow. Right epididymis: Markedly thickened edematous appearing epididymis with hypervascularity consistent with  epididymitis. Left epididymis: 6 mm cyst involving the epididymal head but otherwise no significant findings Hydrocele:  Small hydroceles bilaterally. Varicocele:  None visualized. Pulsed Doppler interrogation of both testes demonstrates normal low resistance arterial and venous waveforms bilaterally. IMPRESSION: 1. Normal sonographic appearance of both testicles with patent intratesticular blood flow. 2. Thickened edematous right epididymis with hypervascularity consistent with acute epididymitis. 3. Small bilateral hydroceles. Electronically Signed   By: Rudie Meyer M.D.   On: 06/29/2018 16:56    Procedures Procedures (including critical care time)  Medications Ordered in ED Medications  cefTRIAXone (ROCEPHIN) injection 250 mg (has no administration in time range)  HYDROcodone-acetaminophen (NORCO/VICODIN) 5-325 MG per tablet 1 tablet (1 tablet Oral Given 06/29/18 1603)     Initial Impression / Assessment and Plan / ED Course  I have reviewed the triage vital signs and the nursing notes.  Pertinent labs & imaging results that were available during my care  of the patient were reviewed by me and considered in my medical decision making (see chart for details).     Upon examination there is tenderness to palpation of right testicle along with swelling. Cremasteric reflex is present BL, making this is less likely  Testicular torsion. Will check UA to r/o any infection or STI along with US Scrotal to r/o any epididimytis or torsion.  I personally review patient's chart, he has a previous  history of substance abuse I will provide him with PO pain medication before [place]. Significant other at the bedside.   UA showed Leukocytes, Hgb large, cloudy and rare bacteria. He reports some stinging with urination but denies hematuria. Patient's US showed acute epididymitis at this time I will treat him with a shot of ceftriaxone along with PO doxycycline twice a day for the next 10 days.  Patient agrees with  this plan.  Patient requesting Norco prescription for pain, at this time have advised patient I cannot send him home on this pain medication and will provide Mobic for his pain.  Understands and agrees with plan.  Return precautions provided. Final Clinical Impressions(s) / ED Diagnoses   Final diagnoses:  Epididymitis    ED Discharge Orders         Ordered    doxycycline (VIBRAMYCIN) 100 MG capsule  2 times daily,   Status:  Discontinued     06/29/18 1724    doxycycline (VIBRAMYCIN) 100 MG capsule  2 times daily     06/29/18 1724    meloxicam (MOBIC) 15 MG tablet  Daily     06/29/18 1725           Claude Manges, PA-C 06/29/18 1728    Mesner, Barbara Cower, MD 06/29/18 2124

## 2018-06-29 NOTE — ED Notes (Signed)
Pt returned from US

## 2018-06-29 NOTE — ED Notes (Signed)
Signature pad in room not working. Pt provided verbal understanding of discharge instructions with teach back.  

## 2018-07-02 LAB — GC/CHLAMYDIA PROBE AMP (~~LOC~~) NOT AT ARMC
CHLAMYDIA, DNA PROBE: NEGATIVE
Neisseria Gonorrhea: NEGATIVE

## 2018-11-05 DIAGNOSIS — M545 Low back pain: Secondary | ICD-10-CM | POA: Diagnosis not present

## 2018-11-05 DIAGNOSIS — F17210 Nicotine dependence, cigarettes, uncomplicated: Secondary | ICD-10-CM | POA: Diagnosis not present

## 2020-01-21 ENCOUNTER — Emergency Department (HOSPITAL_BASED_OUTPATIENT_CLINIC_OR_DEPARTMENT_OTHER)
Admission: EM | Admit: 2020-01-21 | Discharge: 2020-01-21 | Disposition: A | Discharge status: Home / Self Care | Payer: BC Managed Care – PPO | Attending: Emergency Medicine | Admitting: Emergency Medicine

## 2020-01-21 ENCOUNTER — Encounter (HOSPITAL_BASED_OUTPATIENT_CLINIC_OR_DEPARTMENT_OTHER): Payer: Self-pay | Admitting: *Deleted

## 2020-01-21 ENCOUNTER — Emergency Department (HOSPITAL_BASED_OUTPATIENT_CLINIC_OR_DEPARTMENT_OTHER): Payer: BC Managed Care – PPO

## 2020-01-21 ENCOUNTER — Other Ambulatory Visit: Payer: Self-pay

## 2020-01-21 DIAGNOSIS — F1210 Cannabis abuse, uncomplicated: Secondary | ICD-10-CM | POA: Diagnosis not present

## 2020-01-21 DIAGNOSIS — F17210 Nicotine dependence, cigarettes, uncomplicated: Secondary | ICD-10-CM | POA: Insufficient documentation

## 2020-01-21 DIAGNOSIS — R1033 Periumbilical pain: Secondary | ICD-10-CM | POA: Diagnosis not present

## 2020-01-21 DIAGNOSIS — R1084 Generalized abdominal pain: Secondary | ICD-10-CM | POA: Diagnosis not present

## 2020-01-21 DIAGNOSIS — F17220 Nicotine dependence, chewing tobacco, uncomplicated: Secondary | ICD-10-CM | POA: Diagnosis not present

## 2020-01-21 DIAGNOSIS — R1013 Epigastric pain: Secondary | ICD-10-CM | POA: Diagnosis not present

## 2020-01-21 DIAGNOSIS — R112 Nausea with vomiting, unspecified: Secondary | ICD-10-CM | POA: Insufficient documentation

## 2020-01-21 DIAGNOSIS — R1032 Left lower quadrant pain: Secondary | ICD-10-CM | POA: Insufficient documentation

## 2020-01-21 LAB — SEDIMENTATION RATE: Sed Rate: 2 mm/hr (ref 0–16)

## 2020-01-21 LAB — COMPREHENSIVE METABOLIC PANEL
ALT: 17 U/L (ref 0–44)
AST: 17 U/L (ref 15–41)
Albumin: 4.7 g/dL (ref 3.5–5.0)
Alkaline Phosphatase: 62 U/L (ref 38–126)
Anion gap: 9 (ref 5–15)
BUN: 10 mg/dL (ref 6–20)
CO2: 26 mmol/L (ref 22–32)
Calcium: 9.4 mg/dL (ref 8.9–10.3)
Chloride: 105 mmol/L (ref 98–111)
Creatinine, Ser: 1.09 mg/dL (ref 0.61–1.24)
GFR calc Af Amer: >60 mL/min (ref >60)
GFR calc non Af Amer: >60 mL/min (ref >60)
Glucose, Bld: 97 mg/dL (ref 70–99)
Potassium: 3.7 mmol/L (ref 3.5–5.1)
Sodium: 140 mmol/L (ref 135–145)
Total Bilirubin: 1.3 mg/dL — ABNORMAL HIGH (ref 0.3–1.2)
Total Protein: 7.8 g/dL (ref 6.5–8.1)

## 2020-01-21 LAB — URINALYSIS, ROUTINE W REFLEX MICROSCOPIC
Bilirubin Urine: NEGATIVE
Glucose, UA: NEGATIVE mg/dL
Hgb urine dipstick: NEGATIVE
Ketones, ur: NEGATIVE mg/dL
Leukocytes,Ua: NEGATIVE
Nitrite: NEGATIVE
Protein, ur: NEGATIVE mg/dL
Specific Gravity, Urine: <1.005 — ABNORMAL LOW (ref 1.005–1.030)
pH: 7.5 (ref 5.0–8.0)

## 2020-01-21 LAB — CBC WITH DIFFERENTIAL/PLATELET
Abs Immature Granulocytes: 0.03 10*3/uL (ref 0.00–0.07)
Basophils Absolute: 0.1 10*3/uL (ref 0.0–0.1)
Basophils Relative: 1 %
Eosinophils Absolute: 0.3 10*3/uL (ref 0.0–0.5)
Eosinophils Relative: 3 %
HCT: 43 % (ref 39.0–52.0)
Hemoglobin: 14.1 g/dL (ref 13.0–17.0)
Immature Granulocytes: 0 %
Lymphocytes Relative: 19 %
Lymphs Abs: 2.2 10*3/uL (ref 0.7–4.0)
MCH: 30.7 pg (ref 26.0–34.0)
MCHC: 32.8 g/dL (ref 30.0–36.0)
MCV: 93.7 fL (ref 80.0–100.0)
Monocytes Absolute: 0.7 10*3/uL (ref 0.1–1.0)
Monocytes Relative: 6 %
Neutro Abs: 7.9 10*3/uL — ABNORMAL HIGH (ref 1.7–7.7)
Neutrophils Relative %: 71 %
Platelets: 378 10*3/uL (ref 150–400)
RBC: 4.59 MIL/uL (ref 4.22–5.81)
RDW: 13.2 % (ref 11.5–15.5)
WBC: 11.1 10*3/uL — ABNORMAL HIGH (ref 4.0–10.5)
nRBC: 0 % (ref 0.0–0.2)

## 2020-01-21 LAB — OCCULT BLOOD X 1 CARD TO LAB, STOOL: Fecal Occult Bld: NEGATIVE

## 2020-01-21 MED ORDER — IOHEXOL 300 MG/ML  SOLN
100.0000 mL | Freq: Once | INTRAMUSCULAR | Status: AC | PRN
  Administered 2020-01-21: 100 mL via INTRAVENOUS

## 2020-01-21 MED ORDER — METOCLOPRAMIDE HCL 10 MG PO TABS: 10.0000 mg | ORAL_TABLET | Freq: Once | ORAL | Status: DC

## 2020-01-21 MED ORDER — DICYCLOMINE HCL 10 MG PO CAPS
10.0000 mg | ORAL_CAPSULE | Freq: Once | ORAL | Status: AC
  Administered 2020-01-21: 17:00:00 10 mg via ORAL
  Filled 2020-01-21: qty 1

## 2020-01-21 MED ORDER — DICYCLOMINE HCL 20 MG PO TABS: 20.0000 mg | ORAL_TABLET | Freq: Two times a day (BID) | ORAL | 0 refills | Status: AC | PRN

## 2020-01-21 NOTE — ED Provider Notes (Signed)
[facility] EMERGENCY DEPARTMENT Provider Note   CSN: [id number] Arrival date & time: 01/21/20  1518     History Chief Complaint  Patient presents with  . Abdominal Pain    William Mejia is a 29 y.o. male with PMH of substance use disorder and obstructive ureterolithiasis who presents to the ED with 3-day history of generalized abdominal pain. Patient states that his symptoms have been worsening over the course of the past few days. His symptoms last for the majority of the day, but wax and wane.  Worse postprandial. He has been experiencing some dark black stools, as well.  Yesterday he experienced nausea as well as nonbloody emesis.  He describes his abdominal discomfort as intermittently sharp, 8/10.  He denies any recent infection, fevers or chills, chest pain or difficulty breathing, hematemesis, urinary symptoms, constipation, or pain with defecation.  Patient also reports that he has been working approximately 70 hours/week and is unsure if this is a clinical manifestation of his anxiety.  HPI     Past Medical History:  Diagnosis Date  . Anxiety   . Genital warts   . Kidney stone   . Substance abuse [facility])    meth    Patient Active Problem List   Diagnosis Date Noted  . Chronic low back pain 12/04/2013  . 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 WISDOM TOOTH EXTRACTION         Family History  Problem Relation Age of Onset  . Diabetes Mother     Social History   Tobacco Use  . Smoking status: Current Every Day Smoker    Packs/day: 0.50  . Smokeless tobacco: Current User    Types: Chew  Substance Use Topics  . Alcohol use: No  . Drug use: Yes    Types: Marijuana    Home Medications Prior to Admission medications   Medication Sig Start Date End Date Taking? Authorizing Provider  dicyclomine (BENTYL) 20 MG tablet Take 1 tablet (20 mg total) by mouth 2 (two) times daily between meals as  needed for spasms. 01/21/20   Lorelee New, PA-C    Allergies    Patient has no known allergies.  Review of Systems   Review of Systems  All other systems reviewed and are negative.   Physical Exam Updated Vital Signs BP 128/71   Pulse 66   Resp 18   Ht 6\' 1"  (1.854 m)   Wt 83.9 kg   SpO2 100%   BMI 24.41 kg/m   Physical Exam Vitals and nursing note reviewed. Exam conducted with a chaperone present.  Constitutional:      General: He is not in acute distress.    Appearance: Normal appearance. He is not ill-appearing.  HENT:     Head: Normocephalic and atraumatic.  Eyes:     General: No scleral icterus.    Conjunctiva/sclera: Conjunctivae normal.  Cardiovascular:     Rate and Rhythm: Normal rate and regular rhythm.     Pulses: Normal pulses.     Heart sounds: Normal heart sounds.  Pulmonary:     Effort: Pulmonary effort is normal. No respiratory distress.     Breath sounds: Normal breath sounds. No wheezing or rales.  Abdominal:     Comments: Soft, nondistended.  Tender to palpation in LLQ, epigastrium, and periumbilical area.  No significant TTP elsewhere.  No guarding.  No overlying skin changes.  Normoactive bowel sounds.  Musculoskeletal:  Cervical back: Normal range of motion. No rigidity.  Skin:    General: Skin is dry.     Capillary Refill: Capillary refill takes less than 2 seconds.  Neurological:     Mental Status: He is alert and oriented to person, place, and time.     GCS: GCS eye subscore is 4. GCS verbal subscore is 5. GCS motor subscore is 6.  Psychiatric:        Mood and Affect: Mood normal.        Behavior: Behavior normal.        Thought Content: Thought content normal.     ED Results / Procedures / Treatments   Labs (all labs ordered are listed, but only abnormal results are displayed) Labs Reviewed  URINALYSIS, ROUTINE W REFLEX MICROSCOPIC - Abnormal; Notable for the following components:      Result Value   Specific Gravity, Urine  <1.005 (*)    All other components within normal limits  CBC WITH DIFFERENTIAL/PLATELET - Abnormal; Notable for the following components:   WBC 11.1 (*)    Neutro Abs 7.9 (*)    All other components within normal limits  COMPREHENSIVE METABOLIC PANEL - Abnormal; Notable for the following components:   Total Bilirubin 1.3 (*)    All other components within normal limits  SEDIMENTATION RATE  OCCULT BLOOD X 1 CARD TO LAB, STOOL    EKG None  Radiology CT ABDOMEN PELVIS W CONTRAST  Result Date: 01/21/2020 CLINICAL DATA:  Acute generalized abdominal pain. EXAM: CT ABDOMEN AND PELVIS WITH CONTRAST TECHNIQUE: Multidetector CT imaging of the abdomen and pelvis was performed using the standard protocol following bolus administration of intravenous contrast. CONTRAST:  183mL OMNIPAQUE IOHEXOL 300 MG/ML  SOLN COMPARISON:  March 27, 2017. FINDINGS: Lower chest: No acute abnormality. Hepatobiliary: No focal liver abnormality is seen. No gallstones, gallbladder wall thickening, or biliary dilatation. Pancreas: Unremarkable. No pancreatic ductal dilatation or surrounding inflammatory changes. Spleen: Normal in size without focal abnormality. Adrenals/Urinary Tract: Adrenal glands are unremarkable. Kidneys are normal, without renal calculi, focal lesion, or hydronephrosis. Bladder is unremarkable. Stomach/Bowel: Stomach is within normal limits. Appendix appears normal. No evidence of bowel wall thickening, distention, or inflammatory changes. Vascular/Lymphatic: No significant vascular findings are present. No enlarged abdominal or pelvic lymph nodes. Reproductive: Prostate is unremarkable. Other: No abdominal wall hernia or abnormality. No abdominopelvic ascites. Musculoskeletal: No acute or significant osseous findings. IMPRESSION: No abnormality seen in the abdomen or pelvis. Electronically Signed   By: Marijo Conception M.D.   On: 01/21/2020 17:51    Procedures Procedures (including critical care  time)  Medications Ordered in ED Medications  dicyclomine (BENTYL) capsule 10 mg (10 mg Oral Given 01/21/20 1653)  iohexol (OMNIPAQUE) 300 MG/ML solution 100 mL (100 mLs Intravenous Contrast Given 01/21/20 1702)    ED Course  I have reviewed the triage vital signs and the nursing notes.  Pertinent labs & imaging results that were available during my care of the patient were reviewed by me and considered in my medical decision making (see chart for details).    MDM Rules/Calculators/A&P                      Patient's comprehensive laboratory work-up was entirely unremarkable.  He did have mild leukocytosis to 11.1, but no other remarkable findings.  He denies any fevers, chills, body aches, or other symptoms of systemic illness.  Given his generalized abdominal discomfort in setting of leukocytosis, obtain CT abdomen  and pelvis with contrast.  Also obtain sed rate to evaluate for inflammatory disorder.  I personally reviewed imaging which demonstrated no acute intra-abdominal findings.  On reexamination, patient feels mild improvement with Bentyl.  He suspected this is related to his anxiety and depression.  He works 70 hours a week, has a newborn at home, and recently sold his house.  He suspects that this is contributing to his abdominal manifestations.  He has good insurance, however is not yet established with a primary care provider.  He states that the reasons are because of his marijuana use and intermittent Adderall for his previously diagnosed ADHD, however assured him that that should not impede his ability to get established for ongoing evaluation and management.  This patient presents with abdominal pain of unclear etiology. A CT scan was performed to evaluate for potential causes of the abdominal pain, however, neither the clinical exam nor the CT has identified an emergent etiology for the abdominal pain. Specifically, given the benign exam, the laboratory studies, and unremarkable  CT, I have a very low suspicion for appendicitis, ischemic bowel, bowel perforation, or any other life threatening disease. I have discussed with the patient the level of uncertainty with undifferentiated abdominal pain and clearly explained the need to follow-up as noted on the discharge instructions, or return to the Emergency Department immediately if the pain worsens, develops fever, persistent and uncontrollable vomiting, or for any new symptoms or concerns.  Will prescribe short course of Bentyl.  Advised him of side effects given that it is anticholinergic.  Emphasized the importance of lifestyle modifications.  Strict ED return precautions discussed.  All of the evaluation and work-up results were discussed with the patient and any family at bedside. They were provided opportunity to ask any additional questions and have none at this time. They have expressed understanding of verbal discharge instructions as well as return precautions and are agreeable to the plan.    Final Clinical Impression(s) / ED Diagnoses Final diagnoses:  Generalized abdominal pain    Rx / DC Orders ED Discharge Orders         Ordered    dicyclomine (BENTYL) 20 MG tablet  2 times daily between meals PRN     01/21/20 1900           Elvera Maria 01/21/20 1901    Milagros Loll, MD 01/22/20 2355

## 2020-01-21 NOTE — ED Triage Notes (Signed)
Pt c/o diffuse abd pain x 3 weeks , vomiting x 1 day

## 2020-01-21 NOTE — Discharge Instructions (Addendum)
Please take your Bentyl, as needed, for bowel spasms.  It is vitally important that you get established with a primary care provider for ongoing evaluation and management of your health and wellbeing.  Please find out which providers accept your [employer].  I agree with you that some of your abdominal manifestations can potentially be routed in your significant stressors.  Please try to make lifestyle modifications to ease the stress in your life.  Return to the ED or seek immediate medical attention for any new or worsening symptoms.

## 2021-03-29 ENCOUNTER — Encounter (HOSPITAL_BASED_OUTPATIENT_CLINIC_OR_DEPARTMENT_OTHER): Payer: Self-pay

## 2021-03-29 ENCOUNTER — Emergency Department (HOSPITAL_BASED_OUTPATIENT_CLINIC_OR_DEPARTMENT_OTHER): Payer: No Typology Code available for payment source

## 2021-03-29 ENCOUNTER — Emergency Department (HOSPITAL_BASED_OUTPATIENT_CLINIC_OR_DEPARTMENT_OTHER)
Admission: EM | Admit: 2021-03-29 | Discharge: 2021-03-29 | Disposition: A | Discharge status: Home / Self Care | Payer: No Typology Code available for payment source | Attending: Emergency Medicine | Admitting: Emergency Medicine

## 2021-03-29 ENCOUNTER — Other Ambulatory Visit: Payer: Self-pay

## 2021-03-29 DIAGNOSIS — F17210 Nicotine dependence, cigarettes, uncomplicated: Secondary | ICD-10-CM | POA: Insufficient documentation

## 2021-03-29 DIAGNOSIS — M25512 Pain in left shoulder: Secondary | ICD-10-CM | POA: Insufficient documentation

## 2021-03-29 MED ORDER — LIDOCAINE 5 % EX PTCH
1.0000 | MEDICATED_PATCH | CUTANEOUS | Status: DC
  Administered 2021-03-29: 1 via TRANSDERMAL
  Filled 2021-03-29: qty 1

## 2021-03-29 MED ORDER — METHOCARBAMOL 500 MG PO TABS: 500.0000 mg | ORAL_TABLET | Freq: Two times a day (BID) | ORAL | 0 refills | Status: AC

## 2021-03-29 MED ORDER — METHOCARBAMOL 500 MG PO TABS
500.0000 mg | ORAL_TABLET | Freq: Once | ORAL | Status: AC
  Administered 2021-03-29: 500 mg via ORAL
  Filled 2021-03-29: qty 1

## 2021-03-29 NOTE — Discharge Instructions (Addendum)
You are seen in the emergency department today for your shoulder pain.  Your physical exam suggests muscular injury as well as possible rotator cuff injury.   The muscles in your left shoulder/upper back are in what is called spasm, meaning they are inappropriately tightened up.  This can be quite painful.  To help with your pain you may take Tylenol and / or NSAID medication (such as ibuprofen or naproxen) to help with your pain.  Additionally, you have been prescribed a muscle relaxer called Robaxin to help relieve some of the muscle spasm.  Please be advised that this medication may make you very sleepy, so you should not drive or operate heavy machinery while you are taking it.  You may also utilize topical pain relief such as Biofreeze, IcyHot, or topical lidocaine patches.  I also recommend that you apply heat to the area, such as a hot shower or heating pad, and follow heat application with massage of the muscles that are most tight.  Please return to the emergency department if you develop any numbness/tingling/weakness in your arms or legs, any difficulty urinating, or urinary incontinence chest pain, shortness of breath, abdominal pain, nausea or vomiting that does not stop, or any other new severe symptoms. Vascular

## 2021-03-29 NOTE — ED Provider Notes (Signed)
[facility] EMERGENCY DEPARTMENT Provider Note   CSN: [id number] Arrival date & time: 03/29/21  1909     History Chief Complaint  Patient presents with   Shoulder Injury    William Mejia is a 30 y.o. 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  States that he felt a popping sensation in his left shoulder and has had exquisite pain since that time.  Is not taking any OTC analgesia has not applied heat or ice to the area.  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  I have personally reviewed this patient's medical records.  He has history of methamphetamine abuse, anxiety, and kidney stones.   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 Authorizing Provider  dicyclomine (BENTYL) 20 MG tablet Take 1 tablet (20 mg total) by mouth 2 (two) times daily between meals as needed for spasms. 01/21/20   Lorelee New, PA-C    Allergies    Patient has no known allergies.  Review of Systems    Review of Systems  Constitutional: Negative.   HENT: Negative.    Respiratory: Negative.    Cardiovascular: Negative.   Genitourinary: Negative.   Musculoskeletal:  Positive for arthralgias and myalgias.  Skin: Negative.    Physical Exam Updated Vital Signs BP 133/67 (BP Location: Right Arm)   Pulse 94   Temp 98.6 F (37 C) (Oral)   Resp 16   SpO2 99%   Physical Exam Vitals and nursing note reviewed.  HENT:     Head: Normocephalic and atraumatic.  Eyes:     General: No scleral icterus.       Right eye: No discharge.        Left eye: No discharge.     Conjunctiva/sclera: Conjunctivae normal.  Pulmonary:     Effort: Pulmonary effort is normal.  Musculoskeletal:     Right shoulder: Normal.     Left shoulder: Tenderness present. No bony tenderness or crepitus. Decreased range of motion. Normal pulse.     Right upper arm: Normal.     Left upper arm: Normal.     Right forearm: Normal.     Left forearm: Normal.     Right wrist: Normal.     Left wrist: Normal.  Right hand: Normal.     Left hand: Normal.     Cervical back: Normal range of motion. Spasms present. No swelling, deformity, tenderness or bony tenderness. Normal range of motion.     Thoracic back: Spasms and tenderness present. No swelling, deformity or bony tenderness. Normal range of motion.     Lumbar back: Normal.     Comments: Decreased range of motion secondary to pain, positive empty can test on the left arm, spasm and trigger point of the left trapezius. 2+ radial pulses bilaterally, normal cap refill in all 5 digits of the hands bilaterally.  Skin:    General: Skin is warm and dry.     Capillary Refill: Capillary refill takes less than 2 seconds.  Neurological:     General: No focal deficit present.     Mental Status: He is alert.  Psychiatric:        Mood and Affect: Mood normal.    ED Results / Procedures / Treatments   Labs (all labs ordered are listed, but only abnormal results are  displayed) Labs Reviewed - No data to display  EKG None  Radiology DG Shoulder Left  Result Date: 03/29/2021 CLINICAL DATA:  Left shoulder pain after injury.  Felt a pop. EXAM: LEFT SHOULDER - 2+ VIEW COMPARISON:  None. FINDINGS: There is no evidence of fracture or dislocation. There is no evidence of arthropathy or other focal bone abnormality. No Hill-Sachs deformity. Soft tissues are unremarkable. IMPRESSION: Negative radiographs of the left shoulder. Electronically Signed   By: Narda Rutherford M.D.   On: 03/29/2021 19:40    Procedures Procedures   Medications Ordered in ED Medications - No data to display  ED Course  I have reviewed the triage vital signs and the nursing notes.  Pertinent labs & imaging results that were available during my care of the patient were reviewed by me and considered in my medical decision making (see chart for details).    MDM Rules/Calculators/A&P                         30 year old male presents with concern for 24 hours of left shoulder pain. Differential diagnosis includes but is not limited to acute fracture dislocation, ligamentous/tendinous injury, muscular strain or spasm, cervical radiculopathy or strain.  Vital signs are normal on intake.  Musculoskeletal exam revealed tenderness to palpation of the left shoulder with spasm to the point of the left trapezius.  Normal cap refill and radial pulse in left arm, though decreased range of motion, strength, and positive empty can test on the left.  Normal sensation and symmetric strength and grip bilaterally.  Plain films of the left shoulder obtained with no evidence of acute fracture, dislocation, or other focal bony abnormality.  Given reassuring x-rays, as well as physical exam with positive empty can test, concern for supraspinatus tendon injury/other rotator cuff injury.  We will place arm in sling and have patient follow-up closely with sports medicine.  Additionally will offer Lidoderm patch  and muscle relaxer.  Patient is not driving this evening, will administer first dose in the emergency department prior to discharge.  No further work-up warranted in ED at this time as patient is neurovascularly intact in the extremity and there is no acute osseous abnormality to be addressed this evening.  Kartier voiced understanding of his medical evaluation and treatment plan.  Each of his questions was answered to his expressed satisfaction.  Return precautions were given.  Patient is well-appearing, stable, and appropriate for discharge at this time.  This chart was dictated using voice recognition software, Dragon. Despite the best efforts of this provider to proofread and correct errors, errors may still occur which can change documentation meaning.  Final Clinical Impression(s) / ED Diagnoses Final diagnoses:  None    Rx / DC Orders ED Discharge Orders     None        Sherrilee Gilles 04/02/21 1026    Alvira Monday, MD 04/02/21 2152

## 2021-03-29 NOTE — ED Triage Notes (Signed)
Pt states he injured left shoulder last night-NAD-steady gait

## 2022-04-27 ENCOUNTER — Other Ambulatory Visit (HOSPITAL_BASED_OUTPATIENT_CLINIC_OR_DEPARTMENT_OTHER): Payer: Self-pay

## 2022-04-27 ENCOUNTER — Emergency Department (HOSPITAL_BASED_OUTPATIENT_CLINIC_OR_DEPARTMENT_OTHER): Payer: Self-pay

## 2022-04-27 ENCOUNTER — Emergency Department (HOSPITAL_BASED_OUTPATIENT_CLINIC_OR_DEPARTMENT_OTHER)
Admission: EM | Admit: 2022-04-27 | Discharge: 2022-04-27 | Disposition: A | Discharge status: Home / Self Care | Payer: Self-pay | Attending: Emergency Medicine | Admitting: Emergency Medicine

## 2022-04-27 ENCOUNTER — Encounter (HOSPITAL_BASED_OUTPATIENT_CLINIC_OR_DEPARTMENT_OTHER): Payer: Self-pay | Admitting: Emergency Medicine

## 2022-04-27 DIAGNOSIS — W01198A Fall on same level from slipping, tripping and stumbling with subsequent striking against other object, initial encounter: Secondary | ICD-10-CM | POA: Insufficient documentation

## 2022-04-27 DIAGNOSIS — M25511 Pain in right shoulder: Secondary | ICD-10-CM | POA: Insufficient documentation

## 2022-04-27 DIAGNOSIS — Y9389 Activity, other specified: Secondary | ICD-10-CM | POA: Insufficient documentation

## 2022-04-27 DIAGNOSIS — M546 Pain in thoracic spine: Secondary | ICD-10-CM

## 2022-04-27 MED ORDER — CYCLOBENZAPRINE HCL 5 MG PO TABS
5.0000 mg | ORAL_TABLET | Freq: Three times a day (TID) | ORAL | 0 refills | Status: AC | PRN
  Filled 2022-04-27: qty 30, 10d supply, fill #0

## 2022-04-27 NOTE — ED Triage Notes (Signed)
Pt tripped and hit R shoulder and face on wall. Hx of back and neck issues. Pain with movement.

## 2022-04-27 NOTE — Discharge Instructions (Addendum)
We discussed your diagnosis of back pain.  Please take your Flexeril as prescribed.  Please use over-the-counter Tylenol and ibuprofen for your pain as needed.  Please follow-up with your primary care doctor for symptoms or if you do not have a primary care doctor at our [facility] clinic.  Please return to the emergency department if you develop new or worsening symptoms including but not limited to headache, syncope, uncontrollable pain.

## 2022-04-27 NOTE — ED Provider Notes (Addendum)
[facility] EMERGENCY DEPARTMENT Provider Note   CSN: [id number] Arrival date & time: 04/27/22  0901     History  Chief Complaint  Patient presents with   Shoulder Injury    William Mejia is a 31 y.o. male with history of chronic lower back pain, left rotator cuff tear, and anxiety that presents with 1 day of posterior right shoulder pain after a fall.  Patient states that he was playing with his cat when he tripped and lost his balance and hit his right shoulder and the right side of his face on the wall and proceeded to slip down the wall to the floor without catching himself.  The patient states that his pain is 8/10 in severity, does not radiate, worsens with movement and breathing, and improves with relaxation.  The patient states that the pain is similar to muscle strains that he is experienced in the past.  He describes the pain as dull and burning in nature. The patient is not complaining of pain in his right face.  The patient states that he has taken 800 mg of ibuprofen at 2 AM this morning and the 1000 mg of ibuprofen at 7 AM this morning without relief in his symptoms.  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  Patient denies experiencing other recent trauma or injuries.  Patient denies neck pain, headache, vision changes, lightheadedness, dizziness, syncope, weakness, numbness, tingling, chest pain, shortness of breath, numbness, tingling, fever, chills, nausea, vomiting, diarrhea.  The history is provided by the patient.  Shoulder Injury Pertinent negatives include no chest pain, no headaches and no shortness of breath.       Home Medications Prior to Admission medications   Medication Sig Start Date End Date Taking? Authorizing Provider  dicyclomine (BENTYL) 20 MG tablet Take 1 tablet (20 mg total) by mouth 2 (two) times daily between meals as needed for spasms. 01/21/20   Lorelee New, PA-C   methocarbamol (ROBAXIN) 500 MG tablet Take 1 tablet (500 mg total) by mouth 2 (two) times daily. 03/29/21   Sponseller, Eugene Gavia, PA-C      Allergies    Patient has no known allergies.    Review of Systems   Review of Systems  Constitutional:  Negative for chills and fever.  Eyes:  Negative for visual disturbance.  Respiratory:  Negative for shortness of breath.   Cardiovascular:  Negative for chest pain.  Gastrointestinal:  Negative for diarrhea, nausea and vomiting.  Musculoskeletal:        Right posterior shoulder pain.   Neurological:  Negative for dizziness, syncope, weakness, light-headedness, numbness and headaches.    Physical Exam Updated Vital Signs BP 127/77   Pulse 83   Temp 98 F (36.7 C) (Oral)   Resp 16   SpO2 97%  Physical Exam Constitutional:      General: He is not in acute distress. HENT:     Head: Normocephalic and atraumatic.  Cardiovascular:     Rate and Rhythm: Normal rate and regular rhythm.     Pulses: Normal pulses.     Heart sounds: No murmur heard.    No friction rub. No gallop.  Pulmonary:     Effort: Pulmonary effort is normal.     Breath sounds: No wheezing, rhonchi or rales.  Musculoskeletal:        General: No swelling or deformity.     Cervical back: Normal  range of motion. No tenderness.     Comments: Tenderness to palpation of the right upper back near the superior edge of the scapula. Positive empty can test on the right. Pain elicited with abduction of the right arm. Normal range of motion of the right shoulder. No midline spinal tenderness.   Skin:    General: Skin is warm and dry.     Findings: No bruising.  Neurological:     Mental Status: He is alert.     ED Results / Procedures / Treatments   Labs (all labs ordered are listed, but only abnormal results are displayed) Labs Reviewed - No data to display  EKG None  Radiology No results found.  Procedures Procedures    Medications Ordered in ED Medications -  No data to display  ED Course/ Medical Decision Making/ A&P                           Medical Decision Making Hesston Hitchens is a 31 y.o. male with history of chronic lower back pain, left rotator cuff tear, and anxiety that presents with 1 day of posterior right shoulder pain after a fall.  Differential diagnosis includes but is not limited to muscle strain, fracture.  I am suspicious of a muscle strain given that the pain is reproducible with palpation of the right upper back and shoulder with normal range of motion and no deformity.  Will order x-ray of the right shoulder to assess for acute abnormalities. Patient discussed with Dr. Anitra Lauth.  Amount and/or Complexity of Data Reviewed Radiology: ordered and independent interpretation performed. Decision-making details documented in ED Course.  Risk Prescription drug management.  10:07 AM  X-ray of the right shoulder demonstrates no evidence of fracture or dislocation or focal bony abnormality.  The patient's pain is likely secondary to a muscle strain.  Discussed with patient plan to prescribe Flexeril.  Discussed with patient plan to take OTC tylenol and ibuprofen as needed for pain. Discussed with patient plan to follow-up with his primary care doctor for his symptoms.  Discussed with patient plan to return to the emergency department if he develops new or worsening symptoms including but not limited to headache, syncope, uncontrollable pain.  Patient agrees with the plan.        Final Clinical Impression(s) / ED Diagnoses Final diagnoses:  None    Rx / DC Orders ED Discharge Orders     None         Danalee Flath, Gaylyn Cheers, MD 04/27/22 1010    Karoline Caldwell, MD 04/27/22 1016    Gwyneth Sprout, MD 04/27/22 1534

## 2023-03-06 IMAGING — DX DG SHOULDER 2+V*L*
4 series · 4 of 4 positions shown · non-contrast
Comparison: None.

CLINICAL DATA: Left shoulder pain after injury.  Felt a pop.

EXAM:
LEFT SHOULDER - 2+ VIEW

[shoulder grashey]
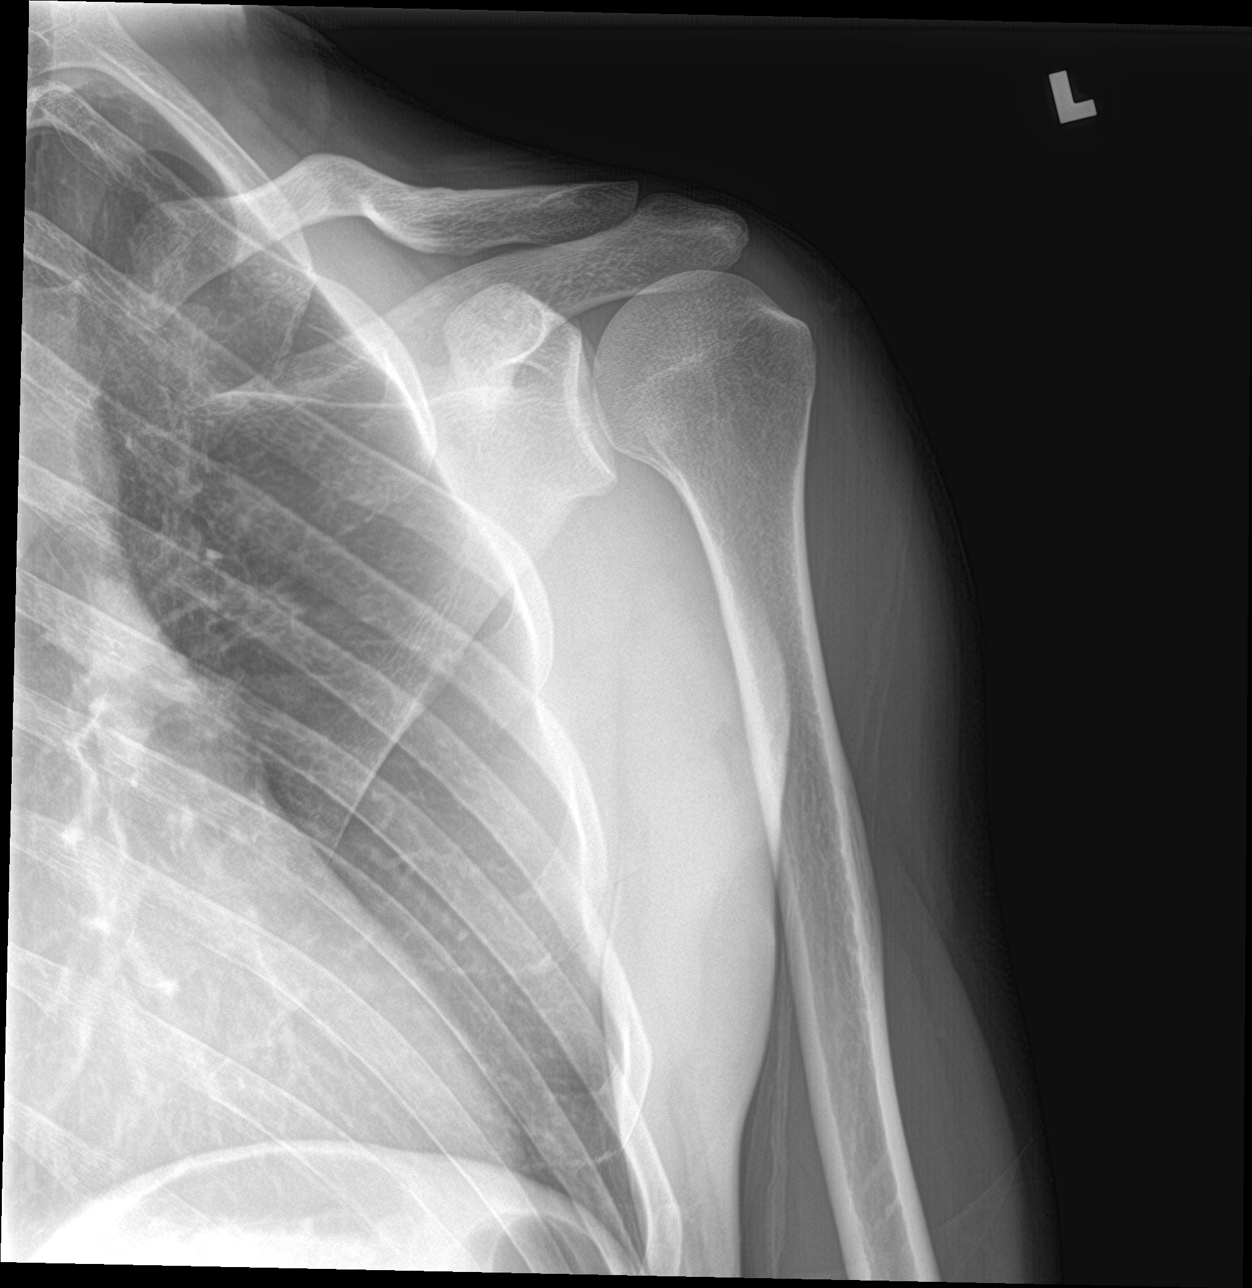

[shoulder ap neutral]
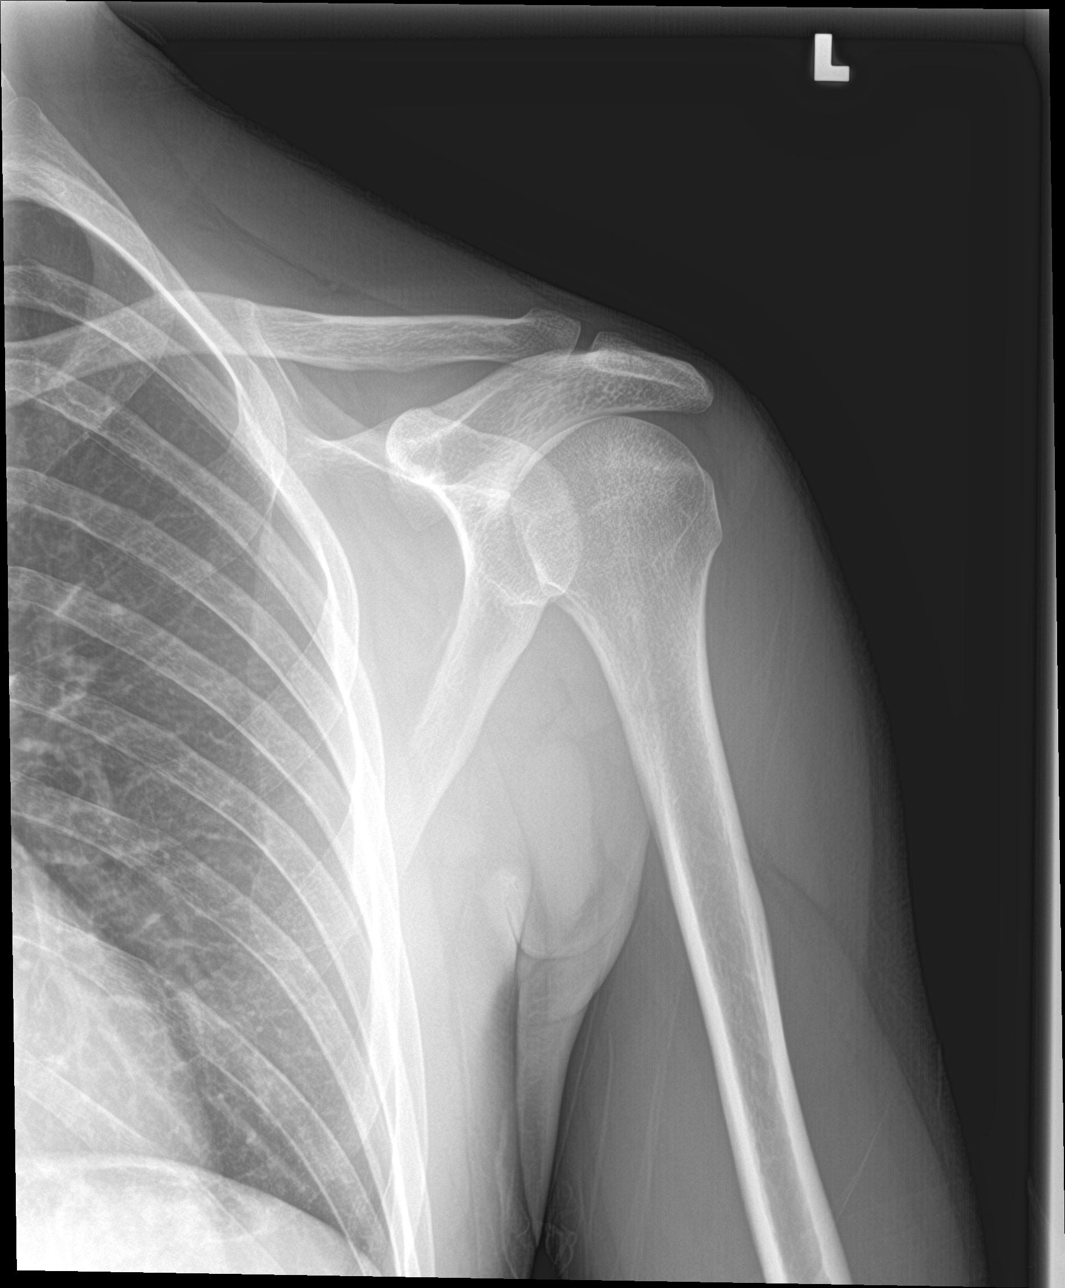

[shoulder y view (1 of 2)]
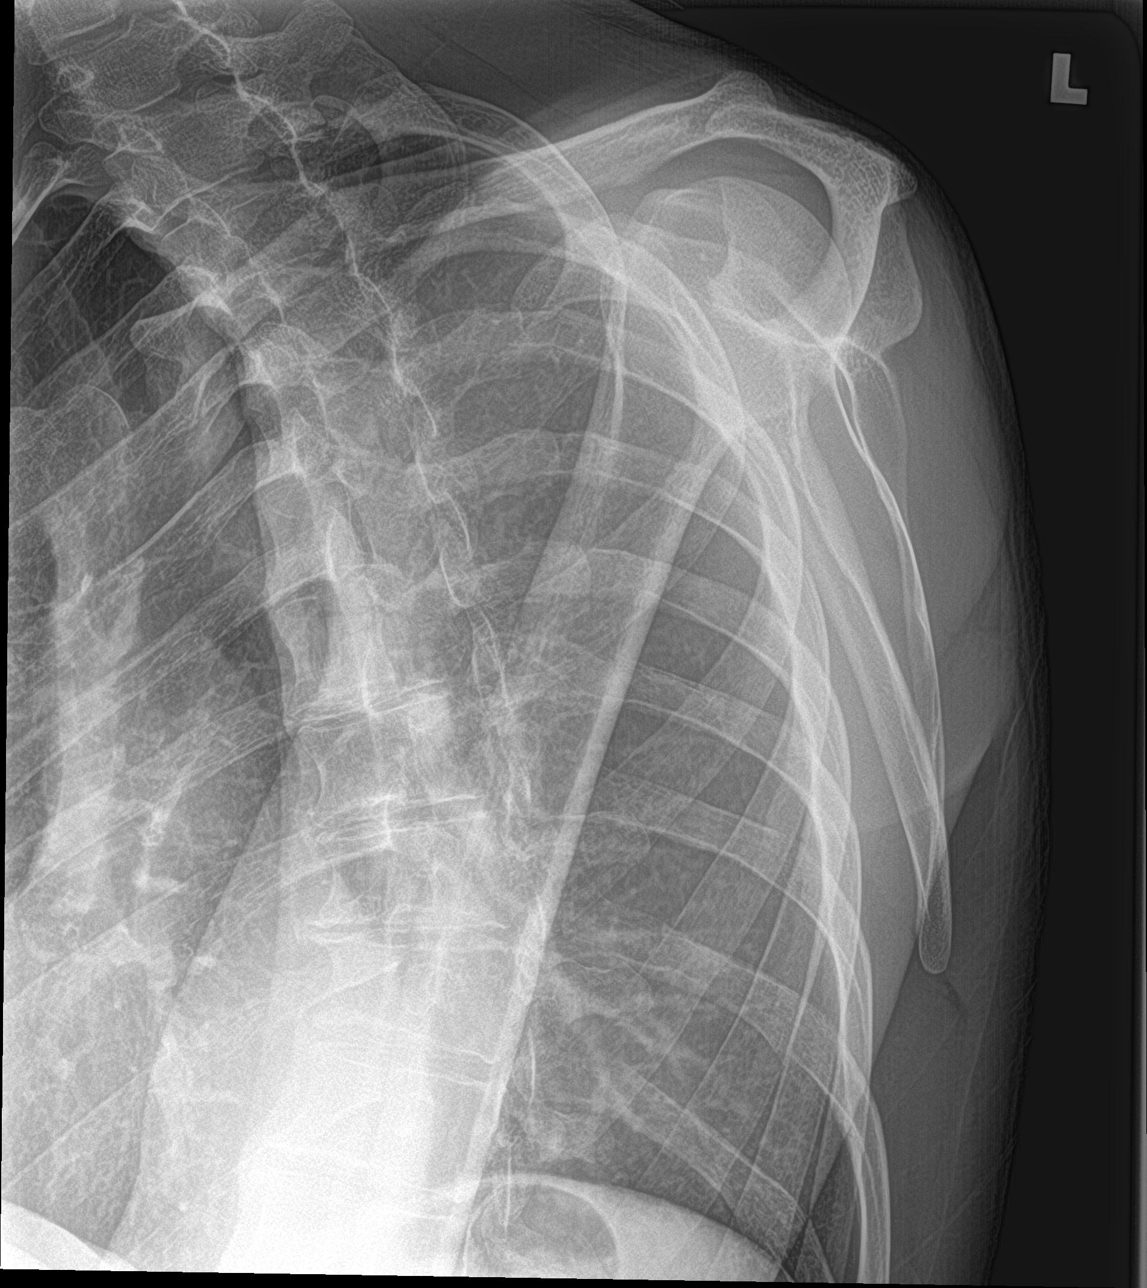

[shoulder y view (2 of 2)]
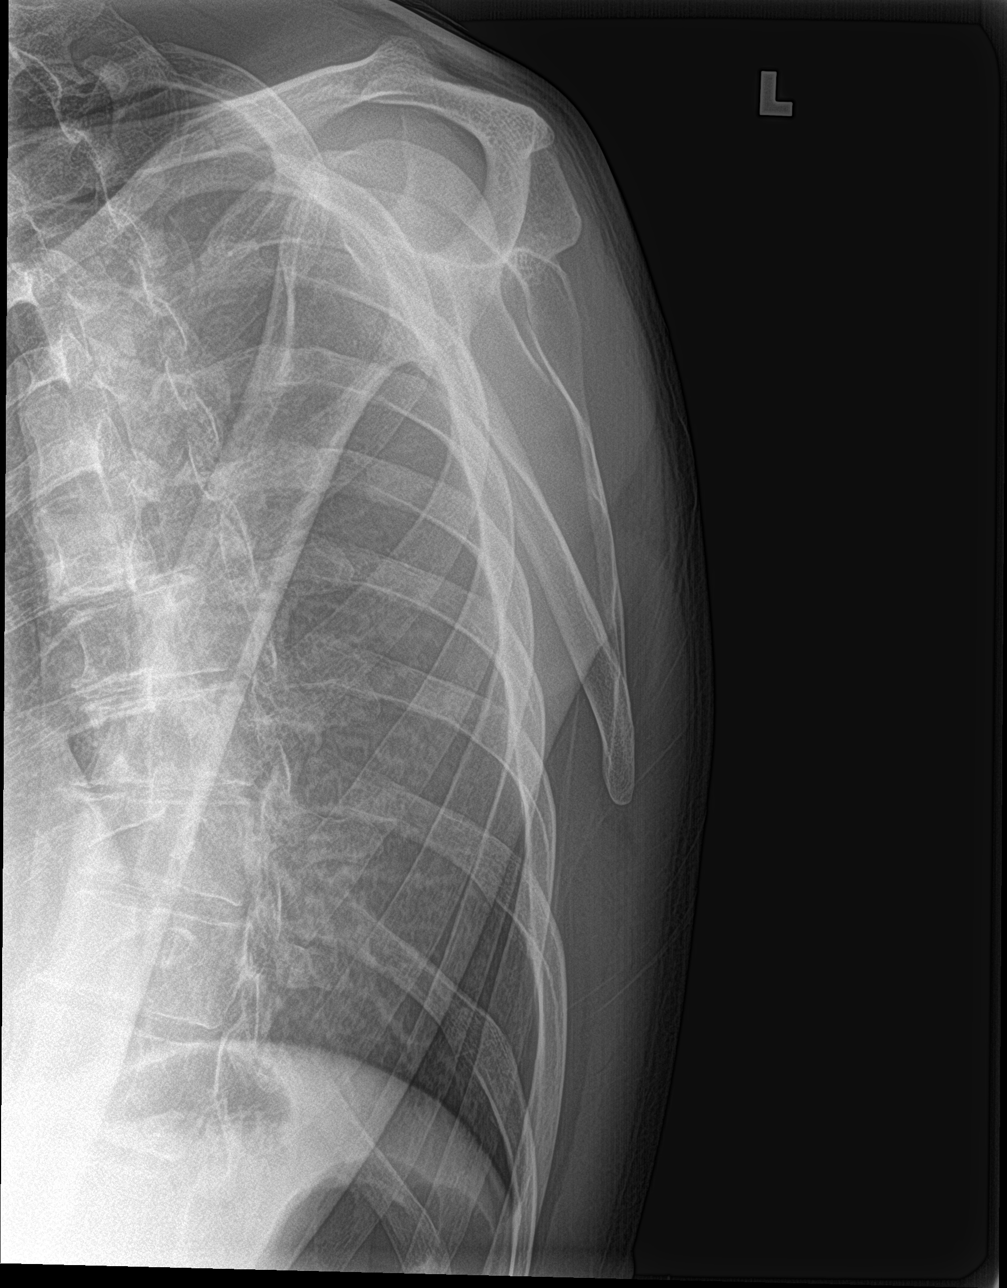

[4 of 4 positions shown; findings below may reference images not displayed]

FINDINGS: There is no evidence of fracture or dislocation. There is no
evidence of arthropathy or other focal bone abnormality. No
Hill-Sachs deformity. Soft tissues are unremarkable.
IMPRESSION: Negative radiographs of the left shoulder.
